# Patient Record
Sex: Female | Born: 1970 | Race: White | Hispanic: No | Marital: Married | Smoking: Never smoker
Health system: Southern US, Community
[De-identification: ages and names within clinical notes are randomized; demographics above are authoritative.]

## PROBLEM LIST (undated history)

## (undated) DIAGNOSIS — Z8742 Personal history of other diseases of the female genital tract: Secondary | ICD-10-CM

## (undated) DIAGNOSIS — I499 Cardiac arrhythmia, unspecified: Secondary | ICD-10-CM

## (undated) DIAGNOSIS — R102 Pelvic and perineal pain unspecified side: Secondary | ICD-10-CM

## (undated) DIAGNOSIS — M75100 Unspecified rotator cuff tear or rupture of unspecified shoulder, not specified as traumatic: Secondary | ICD-10-CM

## (undated) DIAGNOSIS — M25512 Pain in left shoulder: Secondary | ICD-10-CM

## (undated) HISTORY — DX: Unspecified rotator cuff tear or rupture of unspecified shoulder, not specified as traumatic: M75.100

## (undated) HISTORY — DX: Pain in left shoulder: M25.512

---

## 2002-09-26 ENCOUNTER — Encounter: Admission: RE | Admit: 2002-09-26 | Discharge: 2002-09-26 | Payer: Self-pay | Admitting: Obstetrics and Gynecology

## 2002-09-26 ENCOUNTER — Encounter: Payer: Self-pay | Admitting: Obstetrics and Gynecology

## 2003-10-31 ENCOUNTER — Other Ambulatory Visit: Admission: RE | Admit: 2003-10-31 | Discharge: 2003-10-31 | Payer: Self-pay | Admitting: Gynecology

## 2004-08-06 ENCOUNTER — Ambulatory Visit: Payer: Self-pay | Admitting: Internal Medicine

## 2004-09-09 ENCOUNTER — Other Ambulatory Visit: Admission: RE | Admit: 2004-09-09 | Discharge: 2004-09-09 | Payer: Self-pay | Admitting: Obstetrics and Gynecology

## 2004-10-02 ENCOUNTER — Ambulatory Visit (HOSPITAL_COMMUNITY): Admission: RE | Admit: 2004-10-02 | Discharge: 2004-10-02 | Payer: Self-pay | Admitting: Obstetrics and Gynecology

## 2004-10-02 HISTORY — PX: OTHER SURGICAL HISTORY: SHX169

## 2004-10-24 ENCOUNTER — Inpatient Hospital Stay (HOSPITAL_COMMUNITY): Admission: AD | Admit: 2004-10-24 | Discharge: 2004-10-24 | Payer: Self-pay | Admitting: Obstetrics and Gynecology

## 2005-02-05 ENCOUNTER — Encounter: Admission: RE | Admit: 2005-02-05 | Discharge: 2005-02-05 | Payer: Self-pay | Admitting: Obstetrics and Gynecology

## 2005-08-18 ENCOUNTER — Other Ambulatory Visit: Admission: RE | Admit: 2005-08-18 | Discharge: 2005-08-18 | Payer: Self-pay | Admitting: Obstetrics and Gynecology

## 2006-03-02 ENCOUNTER — Inpatient Hospital Stay (HOSPITAL_COMMUNITY): Admission: AD | Admit: 2006-03-02 | Discharge: 2006-03-06 | Payer: Self-pay | Admitting: Obstetrics and Gynecology

## 2009-12-17 ENCOUNTER — Telehealth: Payer: Self-pay | Admitting: Internal Medicine

## 2010-07-31 ENCOUNTER — Encounter
Admission: RE | Admit: 2010-07-31 | Discharge: 2010-07-31 | Payer: Self-pay | Source: Home / Self Care | Attending: Obstetrics and Gynecology | Admitting: Obstetrics and Gynecology

## 2010-08-12 NOTE — Progress Notes (Signed)
Summary: sinus inf x 5 - wants to reest & see you ASAP  Phone Note Call from Patient Call back at Home Phone [phone]   Caller: vm Summary of Call: Sinus infection 5th since before Christmas.  [facility] has been giving antibiotics.  Needs another answer.  Asks to see you ASAP to reest & fu on this problem.   Urgent Care tonight.  Tremendous head pressure & green mucus.  Also has 40 yr old whose ped [place] & would like to talk to you about taking her as patient.    Follow-up for Phone Call        would need records  and np visit again  .   to give opinion.   childfren can be put in wcc. Follow-up by: Madelin Headings MD,  December 19, 2009 10:46 AM  Additional Follow-up for Phone Call Additional follow up Details #1::        lmom also lmom 12-20-09 953am lmom 12-25-09 @ 914am lmom 01-01-2010 @935am   Additional Follow-up by: Heron Sabins,  December 19, 2009 11:29 AM    Additional Follow-up for Phone Call Additional follow up Details #2::    lmom 01-08-10 after several message left on vm pt did not return call Follow-up by: Heron Sabins,  January 08, 2010 9:40 AM  r

## 2010-11-28 NOTE — Op Note (Signed)
Ashlee Graham, Ashlee Graham                 ACCOUNT NO.:  [id number]   MEDICAL RECORD NO.:  [id number]          PATIENT TYPE:  INP   LOCATION:  9109                          FACILITY:  [facility]   PHYSICIAN:  [name], M.D.DATE OF BIRTH:  [birth year]   DATE OF PROCEDURE:  03/03/2006  DATE OF DISCHARGE:                                 OPERATIVE REPORT   PREOPERATIVE DIAGNOSES:  Intrauterine pregnancy at term, failure to progress  at 5 cm.   POSTOPERATIVE DIAGNOSES:  Intrauterine pregnancy at term, failure to  progress at 5 cm.   PROCEDURE:  Primary low transverse cesarean section.   SURGEON:  Dr. Vincente Poli.   ANESTHESIA:  Epidural.   SPECIMENS:  Female infant with Apgar's 8 at 1 minute and 9 at 5 minutes.   ESTIMATED BLOOD LOSS:  500 mL.   DESCRIPTION OF PROCEDURE:  The patient was taken to the operating room.  Her  epidural was dosed.  She was prepped and draped in the usual sterile  fashion.  A Foley catheter had been inserted while in labor and delivery.  A  low transverse incision was made and carried down to the fascia, the fascia  was __________ in the midline and extended laterally.  The rectus muscles  were separated in the midline and peritoneum was entered bluntly.  The  peritoneal incision was then stretched.  The bladder blade was inserted, the  lower uterine segment was identified and the bladder flap was created  sharply and then digitally.  The bladder blade was readjusted. A low  transverse incision was made in the uterus, uterus entered using a hemostat.  The baby was in cephalic presentation.  The baby was delivered with the  assistance of a vacuum extractor without difficulty. The baby was vigorous  on the abdomen and was a female infant, Apgar's 8 at 1 minute and 9 at 5  minutes. The cord was clamped and cut. The baby was handed to the waiting  neonatologist and subsequently taken to the newborn nursery. The placenta  was manually removed and noted to be normal and  intact with a three-vessel  cord.  The uterus was exteriorized and cleared of all clots and debris.  The  uterine incision was closed in layer using #0 chromic in continuous running  locked stitch.  Irrigation was performed after the uterus was returned to  the abdomen.  Hemostasis was excellent.  The peritoneum was reapproximated  using #0 Vicryl as well as the rectus muscles. The fascia was closed using  #0 Vicryl in continuous running stitch starting at each corner and meeting  in the midline. After irrigation of the subcutaneous layer, the skin was  closed with staples.  All sponge, lap and instrument counts were correct x2.  The patient went to the recovery room in stable condition      Michelle L. Vincente Poli, M.D.  Electronically Signed    MLG/MEDQ  D:  03/03/2006  T:  03/03/2006  Job:  [id number]

## 2010-11-28 NOTE — Op Note (Signed)
Ashlee Graham, Ashlee Graham                 ACCOUNT NO.:  [id number]   MEDICAL RECORD NO.:  [id number]          PATIENT TYPE:  AMB   LOCATION:  SDC                           [facility]ILITY:  [facility]   PHYSICIAN:  [name], M.D.DATE OF BIRTH:  03/06/1971   DATE OF PROCEDURE:  10/02/2004  DATE OF DISCHARGE:                                 OPERATIVE REPORT   PREOPERATIVE DIAGNOSIS:  Pelvic pain.   POSTOPERATIVE DIAGNOSES:  1.  Pelvic pain.  2.  Endometriosis involving the cul-de-sac and both ovaries.   PROCEDURE:  Laparoscopy, fulguration of endometriosis.   SURGEON:  Michelle L. Vincente Poli, M.D.   ANESTHESIA:  General.   SPECIMENS:  None.   ESTIMATED BLOOD LOSS:  Minimal.   COMPLICATIONS:  None.   PROCEDURE:  The patient was taken to the operating room.  She was then  intubated without difficulty.  She was prepped and draped in the usual  sterile fashion.  The patient had a uterine manipulator inserted and an in-  and-out catheter was used to empty the bladder of clear urine.  Attention  was turned to the abdomen, where a small infraumbilical incision was made  and the Veress needle was inserted one time without difficulty.  Pneumoperitoneum was then achieved.  The 11 mm trocar was then inserted.  The patient was then placed in Trendelenburg position.  The laparoscope was  introduced into the trocar sheath and the abdomen __________.  We then  placed a secondary 5 mm suprapubic port under direct visualization.  The  pelvis was carefully inspected.  The uterus was of normal size.  The  fallopian tubes appeared normal, and there was no endometriosis or  adhesions.  The fimbriated ends were visualized.  The bladder flap was  clear.  The cul-de-sac was fine except for three small powder burn areas on  the cul-de-sac on the patient's right side.  I used Kleppingers to fulgurate  those with good results.  There were no adhesions noted.  Both ovaries were  free without adhesions; however, there  were notably brownish spots on both  ovaries and one darker powder burn lesion on the left ovary.  All these  lesions were burned with good results.  The ovaries otherwise appeared  unremarkable.  At the end of the procedure, there was no other obvious  visible sign of endometriosis seen.  The pneumoperitoneum was released,  hemostasis was excellent.  The 5 mm trocar was removed under direct  visualization.  The 11 mm trocar was then removed.  We then closed the  incisions  with interrupteds using a 3-0 Vicryl suture.  Local was infiltrated.  All  instruments were removed from the vagina.  All sponge, lap and instrument  counts were correct x2.  The patient was extubated and went to the recovery  room in stable condition.      MLG/MEDQ  D:  10/02/2004  T:  10/02/2004  Job:  [id number]

## 2010-11-28 NOTE — Discharge Summary (Signed)
Ashlee Graham, Ashlee Graham                 ACCOUNT NO.:  [id number]   MEDICAL RECORD NO.:  [id number]          PATIENT TYPE:  INP   LOCATION:  9109                          [facility]ACILITY:  [facility]   PHYSICIAN:  Guy Sandifer. Henderson Cloud, M.D. DATE OF BIRTH:  12/18/1970   DATE OF ADMISSION:  03/02/2006  DATE OF DISCHARGE:  03/06/2006                                 DISCHARGE SUMMARY   ADMITTING DIAGNOSES:  1. Intrauterine pregnancy at 38-2/7th weeks estimated gestational age.  2. Spontaneous onset of labor.   DISCHARGE DIAGNOSES:  1. Status post low transverse cesarean section secondary to failure to      progress.  2. Viable female infant.   PROCEDURE:  Primary low transverse cesarean section.   REASON FOR ADMISSION:  Please see written H&P.   HOSPITAL COURSE:  The patient is 40 year old primigravida that was admitted  to [facility] at 38-2/7th weeks in early labor.  Pregnancy  had been otherwise uncomplicated.  On admission, vital signs stable.  Fetal  heart tones were reactive.  Contractions were noted to be somewhat  irregular.  Cervix was now dilated to 5 cm, 80% effaced, vertex at a -2  station.  Artificial rupture of membranes was performed which revealed clear  fluid.  Epidural was administered for the patient's comfort.  After several  hours in good labor pattern all day, the patient was noted to be unchanged  with cervix remaining 5 cm vertex at a 0 station.  There was rim of scar  tissue that was felt all the way around the cervix and was thought to be  related to previous cryotherapy.  Decision was made to proceed with a  primary low transverse cesarean section for failure to progress.  The  patient was then taken to the operating room where epidural was dosed to an  adequate surgical level.  A low transverse incision was made with delivery  of a viable female infant weighing 7 pounds 6 ounces with Apgar's of 8 at 1  minute and 9 at 5 minutes.  The patient tolerated the  procedure well and  taken to the recovery room in stable condition.   On postoperative day #2, the patient was without complaint.  Vital signs  were stable.  Fundus was firm and nontender.  Abdominal dressing was noted  to be clean, dry and intact.   On postoperative day #2, the patient did complain of some soreness and  drainage from the incisional site.  Vital signs were stable.  She was  afebrile.  Abdomen soft with good return of bowel function.  Fundus firm and  nontender.  Incision was clean, dry and intact and there was a small amount  of ecchymosis that was noted superior to the incisional site.  Small edema  was also noted on the mons veneris.  No active drainage was noted.  Laboratory findings revealed hemoglobin of 9.1, platelet count 143,000, WBC  count of 13.0.   On postoperative day #3, the patient denied headache, blurred vision or  epigastric pain.  Vital signs were stable.  Blood pressure was 111-133/91-  93.  Deep tendon reflexes were 2 to 3+ without clonus.  Small pedal edema  was also noted.  Abdomen was soft.  Fundus firm and nontender.  Incision  continued with ecchymosis superior and inferior to the incisional site with  extension into the labia.  Discharge instructions were reviewed.  PIH labs  were drawn which revealed value of AST of 46 and ALT of 31.  Decision was  made to observe the patient overnight.   On the following morning, the patient complained of a headache, right  occipital without visual changes or epigastric discomfort.  Vital signs  remained stable with blood pressure 115-136/69-89.  Abdomen was soft.  Incision clean, dry and intact.  Deep tendon reflexes 2+ without clonus.  Laboratory findings revealed platelets up to 248, SGOT 52 and SGPT 44,  creatinine was 0.9.  Repeat PIH labs were ordered for the afternoon.  Later  that afternoon, vital signs remained stable.  Laboratory findings of  hemoglobin stable at 10.3, platelet count 231,000, WBC  count 9.0, AST was  down to 47, ALT 39, uric acid was 4.2.  Discharge instructions was reviewed  and the patient was later discharged home.   CONDITION ON DISCHARGE:  Stable.   DIET:  Regular as tolerated.   ACTIVITY:  No heavy lifting, no driving x2 weeks, no vaginal entry.   FOLLOW UP:  The patient is to follow up in the office in 2 days for a blood  pressure check.  She is to call for headache unrelieved by Tylenol,  epigastric pain or blurred vision.  The patient was also encouraged to call  for temperature greater than 100.5, redness or drainage from the incisional  site, heavy vaginal bleeding or development of nausea and vomiting.   DISCHARGE MEDICATIONS:  1. Percocet 5/325, #30, one p.o. q.4-6h. p.r.n.  2. Motrin 600 mg q.6h.  3. Prenatal vitamins one p.o. daily.      Julio Sicks, N.P.      Guy Sandifer. Henderson Cloud, M.D.  Electronically Signed    CC/MEDQ  D:  03/30/2006  T:  03/31/2006  Job:  [id number]

## 2012-03-30 ENCOUNTER — Ambulatory Visit (INDEPENDENT_AMBULATORY_CARE_PROVIDER_SITE_OTHER): Payer: BC Managed Care – PPO | Admitting: Sports Medicine

## 2012-03-30 VITALS — BP 118/70 | Ht 68.0 in | Wt 140.0 lb

## 2012-03-30 DIAGNOSIS — M25512 Pain in left shoulder: Secondary | ICD-10-CM | POA: Insufficient documentation

## 2012-03-30 DIAGNOSIS — M25519 Pain in unspecified shoulder: Secondary | ICD-10-CM

## 2012-03-30 DIAGNOSIS — M719 Bursopathy, unspecified: Secondary | ICD-10-CM

## 2012-03-30 DIAGNOSIS — M75100 Unspecified rotator cuff tear or rupture of unspecified shoulder, not specified as traumatic: Secondary | ICD-10-CM | POA: Insufficient documentation

## 2012-03-30 HISTORY — DX: Unspecified rotator cuff tear or rupture of unspecified shoulder, not specified as traumatic: M75.100

## 2012-03-30 HISTORY — DX: Pain in left shoulder: M25.512

## 2012-03-30 MED ORDER — NITROGLYCERIN 0.2 MG/HR TD PT24: MEDICATED_PATCH | TRANSDERMAL | Status: DC

## 2012-03-30 NOTE — Assessment & Plan Note (Signed)
We reviewed a series of home exercises to include both rotator cuff strengthening and scapular stabilization. She will also implement a feeding gentle stretches to improve her elevation and external rotation.  Begin a nitroglycerin protocol. Plan on returning in 6 weeks for me to reevaluate her examination but we probably do not need to repeat the ultrasound unless symptoms worsen.

## 2012-03-30 NOTE — Assessment & Plan Note (Signed)
She can continue to use ibuprofen as needed but hopefully after starting nitroglycerin will be able to use less of the pain medication

## 2012-03-30 NOTE — Progress Notes (Signed)
  Subjective:    Patient ID: Ashlee Graham, female    DOB: Jul 31, 1970, 41 y.o.   MRN: [id number]  HPI 1. Shoulder pain:  Here with complaint of L shoulder pain that started in February.  She states she was going to boot camp classes and shoulder pain started after a day of doing strenuous shoulder workouts.  Initially hurt with most movements and pushing herself up from a seated position.  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  She has tried rehab exercises, ultrasound and scapular stabilization and has had a significant improvement in pain. She denies swelling, numbness, or tingling down the arm.    No nighttime pain.  For the first few weeks after her injury she was very weak and had to use a sling.  Now the strength while improved is still very weak with overhead activities   Review of Systems Per HPI    Objective:   Physical Exam No acute distress Good posture No limitation of neck motion or pain  Left Shoulder: Inspection reveals mild infraspinatus atrophy but no other atrophied or asymmetry. Palpation with mild tenderness over posterior aspect of shoulder.  No tenderness over AC joint or bicipital groove. ROM is full in all planes except overhead movement Rotator cuff s Positive signs of impingement with Neer and Hawkin's tests, empty can. Speeds and Yergason's tests normal. No labral pathology noted with negative Obrien's, negative clunk and good stability. Normal scapular function observed. No painful arc and no drop arm sign. No apprehension sign  Weakness on impingement seems primarily secondary to pain  MSK ultrasound Infraspinatus tendon is mildly atrophied at its insertion and also shows hyperechoic change consistent with some fibrosis Supraspinatous tendon shows one very small area at the insertion in which there is a 0.2 cm gap Doppler flow is normal Subscapularis and teres minor tendons are normal. Bursa is  normal. Bicipital tendon is normal        Assessment & Plan:

## 2014-04-16 ENCOUNTER — Other Ambulatory Visit (HOSPITAL_COMMUNITY): Payer: Self-pay | Admitting: Obstetrics and Gynecology

## 2014-04-16 DIAGNOSIS — R109 Unspecified abdominal pain: Secondary | ICD-10-CM

## 2014-04-16 DIAGNOSIS — R509 Fever, unspecified: Secondary | ICD-10-CM

## 2014-04-18 ENCOUNTER — Ambulatory Visit (HOSPITAL_COMMUNITY)
Admission: RE | Admit: 2014-04-18 | Discharge: 2014-04-18 | Disposition: A | Discharge status: Home / Self Care | Payer: No Typology Code available for payment source | Source: Ambulatory Visit | Attending: Obstetrics and Gynecology | Admitting: Obstetrics and Gynecology

## 2014-04-18 DIAGNOSIS — R509 Fever, unspecified: Secondary | ICD-10-CM | POA: Diagnosis not present

## 2014-04-18 DIAGNOSIS — R109 Unspecified abdominal pain: Secondary | ICD-10-CM

## 2014-04-20 ENCOUNTER — Ambulatory Visit (HOSPITAL_COMMUNITY): Payer: No Typology Code available for payment source

## 2014-11-12 ENCOUNTER — Other Ambulatory Visit: Payer: Self-pay | Admitting: Obstetrics and Gynecology

## 2014-11-12 ENCOUNTER — Other Ambulatory Visit: Payer: Self-pay

## 2014-11-12 DIAGNOSIS — N63 Unspecified lump in unspecified breast: Secondary | ICD-10-CM

## 2014-11-15 ENCOUNTER — Ambulatory Visit
Admission: RE | Admit: 2014-11-15 | Discharge: 2014-11-15 | Disposition: A | Discharge status: Home / Self Care | Payer: No Typology Code available for payment source | Source: Ambulatory Visit | Attending: Obstetrics and Gynecology | Admitting: Obstetrics and Gynecology

## 2014-11-15 DIAGNOSIS — N63 Unspecified lump in unspecified breast: Secondary | ICD-10-CM

## 2015-08-16 NOTE — H&P (Signed)
  45 year old G 1 P 1 with pelvic pain. She has a history of endometriosis.   Pain has been "gripping" the week before her period and during her period.  She is reporting pain and cramping.  She thought it was her back and has actually gone and had back injections and all of that has not helped her.    Past Medical history: Endometriosis  Past Surgical History: 2 laparoscopies for endometriosis C Section  Meds: None  Allergic to Pennicillin   Social history: Denies alcohol or tobacco  Family history: None  Afebrile Vital signs stable General alert and oriented Lung CTAB Car RRR Abdomen is soft and non tender Cervix no lesions.  Uterus is normal size. Adnexa has significant tenderness in the right levator Left adnexae non tender  IMPRESSION: Chronic pelvic pain History of endometriosis  PLAN: Laparoscopy with Hassan trocar Possible fulguration of endometriosis Risks reviewed with patient

## 2015-08-23 ENCOUNTER — Encounter (HOSPITAL_BASED_OUTPATIENT_CLINIC_OR_DEPARTMENT_OTHER): Payer: Self-pay | Admitting: *Deleted

## 2015-08-23 NOTE — Progress Notes (Signed)
NPO AFTER MN.  ARRIVE AT 0600.  NEEDS HG AND URINE PREG.  

## 2015-08-28 NOTE — Anesthesia Preprocedure Evaluation (Addendum)
Anesthesia Evaluation  Patient identified by MRN, date of birth, ID band Patient awake    Reviewed: Allergy & Precautions, NPO status , Patient's Chart, lab work & pertinent test results  Airway Mallampati: I  TM Distance: >3 FB Neck ROM: Full    Dental  (+) Teeth Intact, Dental Advisory Given   Pulmonary neg pulmonary ROS,    Pulmonary exam normal breath sounds clear to auscultation       Cardiovascular negative cardio ROS Normal cardiovascular exam Rhythm:Regular Rate:Normal     Neuro/Psych negative neurological ROS  negative psych ROS   GI/Hepatic negative GI ROS, Neg liver ROS,   Endo/Other  negative endocrine ROS  Renal/GU negative Renal ROS     Musculoskeletal negative musculoskeletal ROS (+)   Abdominal   Peds  Hematology negative hematology ROS (+)   Anesthesia Other Findings Day of surgery medications reviewed with the patient.  Reproductive/Obstetrics History of endometriosis                            Anesthesia Physical Anesthesia Plan  ASA: II  Anesthesia Plan: General   Post-op Pain Management:    Induction: Intravenous  Airway Management Planned: Oral ETT  Additional Equipment:   Intra-op Plan:   Post-operative Plan: Extubation in OR  Informed Consent: I have reviewed the patients History and Physical, chart, labs and discussed the procedure including the risks, benefits and alternatives for the proposed anesthesia with the patient or authorized representative who has indicated his/her understanding and acceptance.   Dental advisory given  Plan Discussed with: CRNA  Anesthesia Plan Comments: (Risks/benefits of general anesthesia discussed with patient including risk of damage to teeth, lips, gum, and tongue, nausea/vomiting, allergic reactions to medications, and the possibility of heart attack, stroke and death.  All patient questions answered.  Patient  wishes to proceed.)        Anesthesia Quick Evaluation

## 2015-08-29 ENCOUNTER — Encounter (HOSPITAL_BASED_OUTPATIENT_CLINIC_OR_DEPARTMENT_OTHER)
Admission: RE | Disposition: A | Discharge status: Home / Self Care | Payer: Self-pay | Source: Ambulatory Visit | Attending: Obstetrics and Gynecology

## 2015-08-29 ENCOUNTER — Encounter (HOSPITAL_BASED_OUTPATIENT_CLINIC_OR_DEPARTMENT_OTHER): Payer: Self-pay | Admitting: *Deleted

## 2015-08-29 ENCOUNTER — Ambulatory Visit (HOSPITAL_BASED_OUTPATIENT_CLINIC_OR_DEPARTMENT_OTHER): Payer: No Typology Code available for payment source | Admitting: Anesthesiology

## 2015-08-29 ENCOUNTER — Ambulatory Visit (HOSPITAL_BASED_OUTPATIENT_CLINIC_OR_DEPARTMENT_OTHER)
Admission: RE | Admit: 2015-08-29 | Discharge: 2015-08-29 | Disposition: A | Discharge status: Home / Self Care | Payer: No Typology Code available for payment source | Source: Ambulatory Visit | Attending: Obstetrics and Gynecology | Admitting: Obstetrics and Gynecology

## 2015-08-29 DIAGNOSIS — R102 Pelvic and perineal pain: Secondary | ICD-10-CM | POA: Diagnosis present

## 2015-08-29 DIAGNOSIS — N83202 Unspecified ovarian cyst, left side: Secondary | ICD-10-CM | POA: Insufficient documentation

## 2015-08-29 HISTORY — PX: LAPAROSCOPY: SHX197

## 2015-08-29 HISTORY — DX: Personal history of other diseases of the female genital tract: Z87.42

## 2015-08-29 HISTORY — DX: Pelvic and perineal pain: R10.2

## 2015-08-29 HISTORY — DX: Pelvic and perineal pain unspecified side: R10.20

## 2015-08-29 LAB — POCT HEMOGLOBIN-HEMACUE: Hemoglobin: 14.2 g/dL (ref 12.0–15.0)

## 2015-08-29 LAB — POCT PREGNANCY, URINE: PREG TEST UR: NEGATIVE

## 2015-08-29 SURGERY — LAPAROSCOPY, DIAGNOSTIC
Anesthesia: General | Site: Abdomen

## 2015-08-29 MED ORDER — ARTIFICIAL TEARS OP OINT
TOPICAL_OINTMENT | OPHTHALMIC | Status: AC
  Filled 2015-08-29: qty 3.5

## 2015-08-29 MED ORDER — KETOROLAC TROMETHAMINE 30 MG/ML IJ SOLN
INTRAMUSCULAR | Status: DC | PRN
  Administered 2015-08-29: 30 mg via INTRAVENOUS

## 2015-08-29 MED ORDER — NEOSTIGMINE METHYLSULFATE 10 MG/10ML IV SOLN
INTRAVENOUS | Status: AC
  Filled 2015-08-29: qty 1

## 2015-08-29 MED ORDER — PROMETHAZINE HCL 25 MG/ML IJ SOLN
6.2500 mg | INTRAMUSCULAR | Status: DC | PRN
  Filled 2015-08-29: qty 1

## 2015-08-29 MED ORDER — GENTAMICIN SULFATE 40 MG/ML IJ SOLN
INTRAVENOUS | Status: AC
  Administered 2015-08-29: 100 mL via INTRAVENOUS
  Filled 2015-08-29 (×2): qty 8.25

## 2015-08-29 MED ORDER — ROCURONIUM BROMIDE 100 MG/10ML IV SOLN
INTRAVENOUS | Status: AC
  Filled 2015-08-29: qty 1

## 2015-08-29 MED ORDER — OXYCODONE-ACETAMINOPHEN 10-325 MG PO TABS: 1.0000 | ORAL_TABLET | ORAL | Status: DC | PRN

## 2015-08-29 MED ORDER — KETOROLAC TROMETHAMINE 30 MG/ML IJ SOLN
INTRAMUSCULAR | Status: AC
  Filled 2015-08-29: qty 1

## 2015-08-29 MED ORDER — NEOSTIGMINE METHYLSULFATE 10 MG/10ML IV SOLN
INTRAVENOUS | Status: DC | PRN
  Administered 2015-08-29: 4 mg via INTRAVENOUS

## 2015-08-29 MED ORDER — FENTANYL CITRATE (PF) 100 MCG/2ML IJ SOLN
INTRAMUSCULAR | Status: DC | PRN
  Administered 2015-08-29 (×3): 50 ug via INTRAVENOUS

## 2015-08-29 MED ORDER — FENTANYL CITRATE (PF) 100 MCG/2ML IJ SOLN
25.0000 ug | INTRAMUSCULAR | Status: DC | PRN
  Filled 2015-08-29: qty 1

## 2015-08-29 MED ORDER — MIDAZOLAM HCL 2 MG/2ML IJ SOLN
INTRAMUSCULAR | Status: AC
  Filled 2015-08-29: qty 2

## 2015-08-29 MED ORDER — MIDAZOLAM HCL 5 MG/5ML IJ SOLN
INTRAMUSCULAR | Status: DC | PRN
  Administered 2015-08-29: 2 mg via INTRAVENOUS

## 2015-08-29 MED ORDER — LACTATED RINGERS IV SOLN
INTRAVENOUS | Status: DC
  Filled 2015-08-29: qty 1000

## 2015-08-29 MED ORDER — LIDOCAINE HCL (CARDIAC) 20 MG/ML IV SOLN
INTRAVENOUS | Status: AC
  Filled 2015-08-29: qty 5

## 2015-08-29 MED ORDER — FENTANYL CITRATE (PF) 100 MCG/2ML IJ SOLN
INTRAMUSCULAR | Status: AC
  Filled 2015-08-29: qty 2

## 2015-08-29 MED ORDER — GLYCOPYRROLATE 0.2 MG/ML IJ SOLN
INTRAMUSCULAR | Status: AC
  Filled 2015-08-29: qty 3

## 2015-08-29 MED ORDER — LIDOCAINE HCL 4 % EX SOLN
CUTANEOUS | Status: DC | PRN
  Administered 2015-08-29: 2 mL via TOPICAL

## 2015-08-29 MED ORDER — PROPOFOL 10 MG/ML IV BOLUS
INTRAVENOUS | Status: DC | PRN
  Administered 2015-08-29: 200 mg via INTRAVENOUS

## 2015-08-29 MED ORDER — DEXAMETHASONE SODIUM PHOSPHATE 4 MG/ML IJ SOLN
INTRAMUSCULAR | Status: DC | PRN
  Administered 2015-08-29: 10 mg via INTRAVENOUS

## 2015-08-29 MED ORDER — SUCCINYLCHOLINE CHLORIDE 20 MG/ML IJ SOLN
INTRAMUSCULAR | Status: DC | PRN
  Administered 2015-08-29: 100 mg via INTRAVENOUS

## 2015-08-29 MED ORDER — LACTATED RINGERS IV SOLN
INTRAVENOUS | Status: DC
  Administered 2015-08-29 (×2): via INTRAVENOUS
  Filled 2015-08-29: qty 1000

## 2015-08-29 MED ORDER — LIDOCAINE HCL (CARDIAC) 20 MG/ML IV SOLN
INTRAVENOUS | Status: DC | PRN
  Administered 2015-08-29: 60 mg via INTRAVENOUS

## 2015-08-29 MED ORDER — ONDANSETRON HCL 4 MG/2ML IJ SOLN
INTRAMUSCULAR | Status: AC
  Filled 2015-08-29: qty 2

## 2015-08-29 MED ORDER — BUPIVACAINE-EPINEPHRINE (PF) 0.25% -1:200000 IJ SOLN
INTRAMUSCULAR | Status: AC
  Filled 2015-08-29: qty 30

## 2015-08-29 MED ORDER — ONDANSETRON HCL 4 MG/2ML IJ SOLN
INTRAMUSCULAR | Status: DC | PRN
  Administered 2015-08-29: 4 mg via INTRAVENOUS

## 2015-08-29 MED ORDER — ROCURONIUM BROMIDE 100 MG/10ML IV SOLN
INTRAVENOUS | Status: DC | PRN
  Administered 2015-08-29: 20 mg via INTRAVENOUS

## 2015-08-29 MED ORDER — BUPIVACAINE HCL (PF) 0.25 % IJ SOLN
INTRAMUSCULAR | Status: AC
  Filled 2015-08-29: qty 30

## 2015-08-29 MED ORDER — DEXAMETHASONE SODIUM PHOSPHATE 10 MG/ML IJ SOLN
INTRAMUSCULAR | Status: AC
  Filled 2015-08-29: qty 1

## 2015-08-29 MED ORDER — PROPOFOL 10 MG/ML IV BOLUS
INTRAVENOUS | Status: AC
  Filled 2015-08-29: qty 40

## 2015-08-29 MED ORDER — GLYCOPYRROLATE 0.2 MG/ML IJ SOLN
INTRAMUSCULAR | Status: DC | PRN
  Administered 2015-08-29: 0.6 mg via INTRAVENOUS

## 2015-08-29 SURGICAL SUPPLY — 47 items
APPLICATOR COTTON TIP 6IN STRL (MISCELLANEOUS) ×3 IMPLANT
BAG SPEC RTRVL LRG 6X4 10 (ENDOMECHANICALS)
BALLN HASSAN TROCAR (MISCELLANEOUS) ×3 IMPLANT
BANDAGE ADHESIVE 1X3 (GAUZE/BANDAGES/DRESSINGS) ×6 IMPLANT
BLADE SURG 11 STRL SS (BLADE) ×3 IMPLANT
CANISTER SUCTION 2500CC (MISCELLANEOUS) IMPLANT
CLOSURE WOUND 1/4X4 (GAUZE/BANDAGES/DRESSINGS)
DRAPE UNDERBUTTOCKS STRL (DRAPE) ×3 IMPLANT
DRSG TELFA 3X8 NADH (GAUZE/BANDAGES/DRESSINGS) IMPLANT
ELECT REM PT RETURN 9FT ADLT (ELECTROSURGICAL) ×3
ELECTRODE REM PT RTRN 9FT ADLT (ELECTROSURGICAL) ×1 IMPLANT
GLOVE BIO SURGEON STRL SZ 6.5 (GLOVE) ×4 IMPLANT
GLOVE BIO SURGEONS STRL SZ 6.5 (GLOVE) ×2
GOWN STRL REUS W/ TWL LRG LVL3 (GOWN DISPOSABLE) ×2 IMPLANT
GOWN STRL REUS W/TWL LRG LVL3 (GOWN DISPOSABLE) ×6
KIT ROOM TURNOVER WOR (KITS) ×3 IMPLANT
LIQUID BAND (GAUZE/BANDAGES/DRESSINGS) ×2 IMPLANT
MANIFOLD NEPTUNE II (INSTRUMENTS) IMPLANT
NDL HYPO 25X1 1.5 SAFETY (NEEDLE) IMPLANT
NDL INSUFFLATION 14GA 120MM (NEEDLE) ×1 IMPLANT
NDL INSUFFLATION 14GA 150MM (NEEDLE) IMPLANT
NEEDLE HYPO 25X1 1.5 SAFETY (NEEDLE) IMPLANT
NEEDLE INSUFFLATION 14GA 120MM (NEEDLE) ×3 IMPLANT
NEEDLE INSUFFLATION 14GA 150MM (NEEDLE) IMPLANT
NS IRRIG 500ML POUR BTL (IV SOLUTION) ×1 IMPLANT
PACK BASIN DAY SURGERY FS (CUSTOM PROCEDURE TRAY) ×3 IMPLANT
PACK LAPAROSCOPY II (CUSTOM PROCEDURE TRAY) ×3 IMPLANT
PAD OB MATERNITY 4.3X12.25 (PERSONAL CARE ITEMS) ×3 IMPLANT
PAD PREP 24X48 CUFFED NSTRL (MISCELLANEOUS) ×3 IMPLANT
PADDING ION DISPOSABLE (MISCELLANEOUS) ×3 IMPLANT
POUCH SPECIMEN RETRIEVAL 10MM (ENDOMECHANICALS) IMPLANT
SCISSORS LAP 5X35 DISP (ENDOMECHANICALS) IMPLANT
SOLUTION ANTI FOG 6CC (MISCELLANEOUS) ×3 IMPLANT
STRIP CLOSURE SKIN 1/4X4 (GAUZE/BANDAGES/DRESSINGS) IMPLANT
SUT VIC AB 3-0 PS2 18 (SUTURE) ×3
SUT VIC AB 3-0 PS2 18XBRD (SUTURE) ×1 IMPLANT
SUT VICRYL 0 UR6 27IN ABS (SUTURE) ×3 IMPLANT
SYR CONTROL 10ML LL (SYRINGE) IMPLANT
SYRINGE 10CC LL (SYRINGE) ×9 IMPLANT
TOWEL OR 17X24 6PK STRL BLUE (TOWEL DISPOSABLE) ×6 IMPLANT
TRAY DSU PREP LF (CUSTOM PROCEDURE TRAY) ×3 IMPLANT
TRAY FOLEY CATH 16FR SILVER (SET/KITS/TRAYS/PACK) ×2 IMPLANT
TROCAR OPTI TIP 5M 100M (ENDOMECHANICALS) ×3 IMPLANT
TROCAR XCEL DIL TIP R 11M (ENDOMECHANICALS) ×3 IMPLANT
TUBE CONNECTING 12'X1/4 (SUCTIONS)
TUBE CONNECTING 12X1/4 (SUCTIONS) IMPLANT
TUBING INSUFFLATION 10FT LAP (TUBING) ×1 IMPLANT

## 2015-08-29 NOTE — Transfer of Care (Addendum)
Last Vitals:  Filed Vitals:   08/29/15 0616  BP: 111/71  Pulse: 59  Temp: 36.9 C  Resp: 16   Immediate Anesthesia Transfer of Care Note  Patient: Ashlee Graham  Procedure(s) Performed: Procedure(s) (LRB): LAPAROSCOPY DIAGNOSTIC , drainage of left ovarian cyst (N/A)  Patient Location: PACU  Anesthesia Type: General  Level of Consciousness: awake, alert  and oriented  Airway & Oxygen Therapy: Patient Spontanous Breathing and Patient connected to nasal cannula oxygen  Post-op Assessment: Report given to PACU RN and Post -op Vital signs reviewed and stable  Post vital signs: Reviewed and stable  Complications: No apparent anesthesia complications

## 2015-08-29 NOTE — Anesthesia Postprocedure Evaluation (Signed)
Anesthesia Post Note  Patient: Ashlee Graham  Procedure(s) Performed: Procedure(s) (LRB): LAPAROSCOPY DIAGNOSTIC , drainage of left ovarian cyst (N/A)  Patient location during evaluation: PACU Anesthesia Type: General Level of consciousness: awake and alert Pain management: pain level controlled Vital Signs Assessment: post-procedure vital signs reviewed and stable Respiratory status: spontaneous breathing, nonlabored ventilation, respiratory function stable and patient connected to nasal cannula oxygen Cardiovascular status: blood pressure returned to baseline and stable Postop Assessment: no signs of nausea or vomiting Anesthetic complications: no    Last Vitals:  Filed Vitals:   08/29/15 0915 08/29/15 1025  BP: 117/79 122/77  Pulse: 59 48  Temp:  36.5 C  Resp: 16 14    Last Pain:  Filed Vitals:   08/29/15 1030  PainSc: 1                  Cecile Hearing

## 2015-08-29 NOTE — Discharge Instructions (Signed)

## 2015-08-29 NOTE — Anesthesia Procedure Notes (Signed)
Procedure Name: Intubation Date/Time: 08/29/2015 7:35 AM Performed by: Mechele Claude Pre-anesthesia Checklist: Patient identified, Emergency Drugs available, Suction available and Patient being monitored Patient Re-evaluated:Patient Re-evaluated prior to inductionOxygen Delivery Method: Circle System Utilized Preoxygenation: Pre-oxygenation with 100% oxygen Intubation Type: IV induction Ventilation: Mask ventilation without difficulty Laryngoscope Size: Mac and 3 Grade View: Grade I Tube type: Oral Number of attempts: 1 Airway Equipment and Method: Stylet and LTA kit utilized Placement Confirmation: ETT inserted through vocal cords under direct vision,  positive ETCO2 and breath sounds checked- equal and bilateral Secured at: 21 cm Tube secured with: Tape Dental Injury: Teeth and Oropharynx as per pre-operative assessment

## 2015-08-29 NOTE — Brief Op Note (Signed)
08/29/2015  8:19 AM  PATIENT:  Ashlee Graham  45 y.o. female  PRE-OPERATIVE DIAGNOSIS:  Pelvic pain History of endometriosis  POST-OPERATIVE DIAGNOSIS:   Same  PROCEDURE:  Procedure(s): LAPAROSCOPY DIAGNOSTIC , drainage of left ovarian cyst (N/A)  SURGEON:  Surgeon(s) and Role:    * Marcelle Overlie, MD - Primary  PHYSICIAN ASSISTANT:   ASSISTANTS: none   ANESTHESIA:   general  EBL:  Total I/O In: 1000 [I.V.:1000] Out: -   BLOOD ADMINISTERED:none  DRAINS: none   LOCAL MEDICATIONS USED:  NONE  SPECIMEN:  No Specimen  DISPOSITION OF SPECIMEN:  N/A  COUNTS:  YES  TOURNIQUET:  * No tourniquets in log *  DICTATION: .Other Dictation: Dictation Number [id number]  PLAN OF CARE: Discharge to home after PACU  PATIENT DISPOSITION:  PACU - hemodynamically stable.   Delay start of Pharmacological VTE agent (>24hrs) due to surgical blood loss or risk of bleeding: not applicable

## 2015-08-29 NOTE — Progress Notes (Signed)
H and P on the chart No changes Will proceed with Diagnostic Laparoscopy Consent signed 

## 2015-08-30 ENCOUNTER — Encounter (HOSPITAL_BASED_OUTPATIENT_CLINIC_OR_DEPARTMENT_OTHER): Payer: Self-pay | Admitting: Obstetrics and Gynecology

## 2015-08-30 NOTE — Op Note (Signed)
NAMESMERA, GUYETTE NO.:  [id number]  MEDICAL RECORD NO.:  [id number]  LOCATION:                                 FACILITY:  PHYSICIAN:  Jahshua Bonito L. Theseus Birnie, M.D.DATE OF BIRTH:  10-18-1970  DATE OF PROCEDURE:  08/29/2015 DATE OF DISCHARGE:                              OPERATIVE REPORT   PREOPERATIVE DIAGNOSES: 1. Pelvic pain. 2. History of endometriosis.  POSTOPERATIVE DIAGNOSES: 1. Pelvic pain. 2. History of endometriosis.  PROCEDURES:  Diagnostic laparoscopy and drainage of left ovarian cyst.  SURGEON:  Jazyah Butsch L. Vincente Poli, M.D.  ANESTHESIA:  General.  COMPLICATIONS:  None.  DRAINS:  None.  ESTIMATED BLOOD LOSS:  Minimal.  PROCEDURE:  The patient was taken to the operating room.  She was intubated.  She was prepped and draped.  A uterine manipulator was inserted and a Foley catheter was inserted and draining clear urine. Attention was turned to the abdomen where a small incision was made, and we directly entered the fascia using Mayo scissors.  We then entered the peritoneum with Mayo scissors.  I then swept my finger around the incision, and noted that there were no immediate adhesions inside, and placed my angled stitches on either side.  I then inserted the Hasson trocar and insufflated the abdominal cavity.  The laparoscope scope was inserted, and I did notice that there was a small adhesion of the omentum just to the lower left of my incision.  This is not in the area where the patient's pain is.  We then inserted a secondary trocar under direct visualization using a 5 mm trocar.  I then inspected the pelvis; noted the uterus to be normal, and the right tube and right ovary were normal.  The left ovary had a small hemorrhagic cyst, which I had drained with the electrocautery.  There was no area of endometriosis or no adhesions noted.  I then inspected the right upper quadrant.  No adhesions were noted.  No endometriotic implants  were noted.  The gallbladder appeared to be normal.  I did ask Dr. Maisie Fus of General Surgery to come in and inspect the gallbladder, and she felt it was normal in appearance.  The pneumoperitoneum was released.  The trocars were removed.  The angled stitches were tied down.  These incisions were closed with Dermabond and suture.  Hemostasis was noted.  All instruments were removed from the vagina.  The Foley was removed.  The patient was extubated and went to recovery room in stable condition.     Nikeia Henkes L. Vincente Poli, M.D.     Florestine Avers  D:  08/29/2015  T:  08/29/2015  Job:  [id number]

## 2015-12-04 ENCOUNTER — Other Ambulatory Visit: Payer: Self-pay | Admitting: Neurological Surgery

## 2015-12-10 ENCOUNTER — Other Ambulatory Visit (HOSPITAL_COMMUNITY): Payer: Self-pay | Admitting: Neurological Surgery

## 2015-12-10 DIAGNOSIS — M4317 Spondylolisthesis, lumbosacral region: Secondary | ICD-10-CM

## 2015-12-17 ENCOUNTER — Ambulatory Visit (HOSPITAL_COMMUNITY): Payer: No Typology Code available for payment source

## 2015-12-19 ENCOUNTER — Ambulatory Visit (HOSPITAL_COMMUNITY)
Admission: RE | Admit: 2015-12-19 | Discharge: 2015-12-19 | Disposition: A | Discharge status: Home / Self Care | Payer: No Typology Code available for payment source | Source: Ambulatory Visit | Attending: Neurological Surgery | Admitting: Neurological Surgery

## 2015-12-19 ENCOUNTER — Encounter (HOSPITAL_COMMUNITY): Payer: Self-pay

## 2015-12-19 DIAGNOSIS — M4316 Spondylolisthesis, lumbar region: Secondary | ICD-10-CM | POA: Diagnosis not present

## 2015-12-19 DIAGNOSIS — M4806 Spinal stenosis, lumbar region: Secondary | ICD-10-CM | POA: Insufficient documentation

## 2015-12-19 DIAGNOSIS — M4807 Spinal stenosis, lumbosacral region: Secondary | ICD-10-CM | POA: Diagnosis not present

## 2015-12-19 DIAGNOSIS — M4317 Spondylolisthesis, lumbosacral region: Secondary | ICD-10-CM | POA: Insufficient documentation

## 2016-08-05 ENCOUNTER — Other Ambulatory Visit: Payer: Self-pay | Admitting: Obstetrics and Gynecology

## 2016-08-05 DIAGNOSIS — R928 Other abnormal and inconclusive findings on diagnostic imaging of breast: Secondary | ICD-10-CM

## 2016-08-07 ENCOUNTER — Ambulatory Visit
Admission: RE | Admit: 2016-08-07 | Discharge: 2016-08-07 | Disposition: A | Discharge status: Home / Self Care | Payer: No Typology Code available for payment source | Source: Ambulatory Visit | Attending: Obstetrics and Gynecology | Admitting: Obstetrics and Gynecology

## 2016-08-07 DIAGNOSIS — R928 Other abnormal and inconclusive findings on diagnostic imaging of breast: Secondary | ICD-10-CM

## 2017-04-12 NOTE — Progress Notes (Signed)
CARDIOLOGY OFFICE NOTE  Date:  04/13/2017    Ashlee Graham Date of Birth: 1970-09-29 Medical Record #[id number]  PCP:  Ashlee Overlie, MD  Cardiologist:  Ashlee Graham (NEW/DOD)   Chief Complaint  Patient presents with  . Chest Pain    New patient visit - seen for Dr. Mayford Knife (NEW)    History of Present Illness: Ashlee Graham is a 46 y.o. female who presents today for a new patient visit. Seen for Dr. Mayford Knife (DOD).   She has no known CAD.   Referred here by her GYN - Dr. Vincente Graham - has +FH for early CAD.   Comes in today. Here alone.  Here for +FH - her father died in his early 62's with MI. This just happened back in March. Multiple other family members on her dad's side have had early CAD/MI. Mother is alive - has had breast cancer. 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 She has had some twinges of chest pain across her chest that are fleeting. She has had some back pain intermittently. She tries to exercise but one day got lightheaded when she had not eaten or drank enough fluids. She notes lots  She has multiple somatic complaints. She is wondering if she has "adrenal fatigue". She does feel like stress is a big trigger for her. She does not have PCP. Her lipids from January showed an LDL of 92 and HLD of 67. She does not smoke. No HTN.   Past Medical History:  Diagnosis Date  . History of endometriosis   . Pelvic pain in female   . Rotator cuff syndrome 03/30/2012   This is now chronic for 7 months involving the left shoulder. I suspect at the onset of the injury she probably had a partial tear of her supraspinatous and infraspinatus that appear to have essentially healed but left her with some chronic tendinopathy.   . Shoulder pain, left 03/30/2012   This is chronic now for 7 months but does respond to ibuprofen     Past Surgical History:  Procedure Laterality Date  . CESAREAN SECTION  03-03-2006  . LAPAROSCOPY  N/A 08/29/2015   Procedure: LAPAROSCOPY DIAGNOSTIC , drainage of left ovarian cyst;  Surgeon: Ashlee Overlie, MD;  Location: [facility];  Service: Gynecology;  Laterality: N/A;  . LAPAROSCOPY W/ FULGERATION ENDOMETRIOSIS  10-02-2004     Medications: Current Meds  Medication Sig  . ALPRAZolam (XANAX) 0.5 MG tablet TAKE 1 TABLET EVERY 8 HOURS AS NEEDED FOR ANXIETY  . ibuprofen (ADVIL,MOTRIN) 200 MG tablet Take 200 mg by mouth every 6 (six) hours as needed.     Allergies: Allergies  Allergen Reactions  . Ampicillin Other (See Comments)    "Severe chest pain"  . Penicillins Other (See Comments)    Unknown childhood reaction    Social History: The patient  reports that she has never smoked. She has never used smokeless tobacco. She reports that she does not drink alcohol or use drugs.   Family History: The patient's family history includes Heart attack (age of onset: 73) in her father.   Review of Systems: Please see the history of present illness.   Otherwise, the review of systems is positive for none.   All other systems are reviewed and negative.   Physical Exam: VS:  BP 120/80 (BP Location: Left Arm, Patient Position: Sitting, Cuff Size: Normal)   Pulse (!) 55  Ht  (1.727 m)   Wt 145 lb 1.9 oz (65.8 kg)   BMI 22.07 kg/m  .  BMI Body mass index is 22.07 kg/m.  Wt Readings from Last 3 Encounters:  04/13/17 145 lb 1.9 oz (65.8 kg)  08/29/15 149 lb (67.6 kg)  03/30/12 140 lb (63.5 kg)    General: Pleasant. Well developed, well nourished and in no acute distress.   HEENT: Normal.  Neck: Supple, no JVD, carotid bruits, or masses noted.  Cardiac: Regular rate and rhythm. No murmurs, rubs, or gallops. No edema.  Respiratory:  Lungs are clear to auscultation bilaterally with normal work of breathing.  GI: Soft and nontender.  MS: No deformity or atrophy. Gait and ROM intact.  Skin: Warm and dry. Color is normal.  Neuro:  Strength and sensation are  intact and no gross focal deficits noted.  Psych: Alert, appropriate and with normal affect.   LABORATORY DATA:  EKG:  EKG is ordered today. This demonstrates sinus bradycardia - rate is 55. Reviewed with Dr. Mayford Knife (DOD).   Lab Results  Component Value Date   HGB 14.2 08/29/2015     BNP (last 3 results) No results for input(s): BNP in the last 8760 hours.  ProBNP (last 3 results) No results for input(s): PROBNP in the last 8760 hours.   Other Studies Reviewed Today:   Assessment/Plan: 1. Multiple somatic complaints. No clear angina. Only risk factor is her FH for early CAD.   2. Strongly +FH for early CAD - father with acute MI/death at age 97.    Will arrange for GXT. Would check CT with calcium score - if elevated would start statin therapy.   3. Stress/anxiety - discussed at length - needs to work on priorities and make herself a priority. Regular exercise also encouraged.   4. Fatigue - she is wondering if she has adrenal issues - will get her to PCP for possible work up.   Current medicines are reviewed with the patient today.  The patient does not have concerns regarding medicines other than what has been noted above.  The following changes have been made:  See above.  Labs/ tests ordered today include:    Orders Placed This Encounter  Procedures  . CT CARDIAC SCORING  . EKG 12-Lead  . EXERCISE TOLERANCE TEST (ETT)     Disposition:   FU prn. Would advise periodic stress testing in light of her FH.   Patient is agreeable to this plan and will call if any problems develop in the interim.   SignedNorma Fredrickson, NP  04/13/2017 9:00 AM  [facility] [address] [place], [place]  [address] Phone: [phone] Fax: [phone]

## 2017-04-13 ENCOUNTER — Encounter (INDEPENDENT_AMBULATORY_CARE_PROVIDER_SITE_OTHER): Payer: Self-pay

## 2017-04-13 ENCOUNTER — Ambulatory Visit (INDEPENDENT_AMBULATORY_CARE_PROVIDER_SITE_OTHER): Payer: No Typology Code available for payment source | Admitting: Nurse Practitioner

## 2017-04-13 ENCOUNTER — Other Ambulatory Visit: Payer: Self-pay | Admitting: Cardiovascular Disease

## 2017-04-13 ENCOUNTER — Ambulatory Visit (INDEPENDENT_AMBULATORY_CARE_PROVIDER_SITE_OTHER)
Admission: RE | Admit: 2017-04-13 | Discharge: 2017-04-13 | Disposition: A | Discharge status: Home / Self Care | Payer: Self-pay | Source: Ambulatory Visit | Attending: Nurse Practitioner | Admitting: Nurse Practitioner

## 2017-04-13 ENCOUNTER — Telehealth: Payer: Self-pay | Admitting: Nurse Practitioner

## 2017-04-13 ENCOUNTER — Encounter: Payer: Self-pay | Admitting: Nurse Practitioner

## 2017-04-13 VITALS — BP 120/80 | HR 55 | Ht 68.0 in | Wt 145.1 lb

## 2017-04-13 DIAGNOSIS — R079 Chest pain, unspecified: Secondary | ICD-10-CM | POA: Diagnosis not present

## 2017-04-13 NOTE — Telephone Encounter (Signed)
Called CT calcium scoring. Right pulmonary lung nodule. If low risk no f/u/ if high risk f/u CT w/o contrats in 12 months.

## 2017-04-13 NOTE — Telephone Encounter (Signed)
New message    Jari Pigg @ [facility] - [phone] Re: cardiac overread result

## 2017-04-13 NOTE — Patient Instructions (Addendum)
We will be checking the following labs today - NONE   Medication Instructions:    Continue with your current medicines.     Testing/Procedures To Be Arranged:  CT calcium scoring  GXT  Follow-Up:   Will see how your studies turn out - if ok - would recommend periodic stress testing every few years.      Other Special Instructions:   Think about what we talked about today.   [facility] - Dr. Larita Fife [phone]    If you need a refill on your cardiac medications before your next appointment, please call your pharmacy.   Call the [facility] office at [phone] if you have any questions, problems or concerns.

## 2017-04-16 ENCOUNTER — Telehealth: Payer: Self-pay | Admitting: Nurse Practitioner

## 2017-04-16 NOTE — Telephone Encounter (Signed)
New message    Patient wants to know if she is medically ok to wait until January to get stress test. Please advise.

## 2017-04-16 NOTE — Telephone Encounter (Signed)
This choice is up to her - but I would prefer that we go ahead - especially in light of having symptoms.

## 2017-04-16 NOTE — Telephone Encounter (Signed)
Will forward to Norma Fredrickson NP for advisement.

## 2017-04-19 NOTE — Telephone Encounter (Signed)
S/w pt is going to keep appt for gxt on Wednesday. Pt was concerned about the cost of test hasn't met deductible.  Stated would have to call insurance company and if does not get answer to call billing dept.

## 2017-04-21 ENCOUNTER — Ambulatory Visit (INDEPENDENT_AMBULATORY_CARE_PROVIDER_SITE_OTHER): Payer: No Typology Code available for payment source

## 2017-04-21 DIAGNOSIS — R079 Chest pain, unspecified: Secondary | ICD-10-CM

## 2017-04-21 LAB — EXERCISE TOLERANCE TEST
Estimated workload: 11.7 METS
Exercise duration (min): 10 min
Exercise duration (sec): 4 s
MPHR: 174 {beats}/min
Peak HR: 173 {beats}/min
Percent HR: 99 %
RPE: 17
Rest HR: 62 {beats}/min

## 2017-07-14 ENCOUNTER — Other Ambulatory Visit: Payer: Self-pay | Admitting: Obstetrics and Gynecology

## 2017-07-14 DIAGNOSIS — Z1231 Encounter for screening mammogram for malignant neoplasm of breast: Secondary | ICD-10-CM

## 2017-08-22 ENCOUNTER — Other Ambulatory Visit: Payer: Self-pay | Admitting: General Surgery

## 2017-08-22 DIAGNOSIS — N6320 Unspecified lump in the left breast, unspecified quadrant: Secondary | ICD-10-CM

## 2017-09-13 ENCOUNTER — Other Ambulatory Visit: Payer: Self-pay | Admitting: General Surgery

## 2018-10-12 ENCOUNTER — Other Ambulatory Visit: Payer: Self-pay

## 2018-10-12 ENCOUNTER — Encounter (HOSPITAL_COMMUNITY): Payer: Self-pay | Admitting: Family Medicine

## 2018-10-12 ENCOUNTER — Ambulatory Visit (HOSPITAL_COMMUNITY)
Admission: EM | Admit: 2018-10-12 | Discharge: 2018-10-12 | Disposition: A | Discharge status: Home / Self Care | Payer: No Typology Code available for payment source | Attending: Family Medicine | Admitting: Family Medicine

## 2018-10-12 ENCOUNTER — Ambulatory Visit (INDEPENDENT_AMBULATORY_CARE_PROVIDER_SITE_OTHER): Payer: No Typology Code available for payment source

## 2018-10-12 DIAGNOSIS — R509 Fever, unspecified: Secondary | ICD-10-CM | POA: Diagnosis not present

## 2018-10-12 DIAGNOSIS — R0789 Other chest pain: Secondary | ICD-10-CM

## 2018-10-12 DIAGNOSIS — R05 Cough: Secondary | ICD-10-CM

## 2018-10-12 DIAGNOSIS — R059 Cough, unspecified: Secondary | ICD-10-CM

## 2018-10-12 MED ORDER — HYDROCODONE-HOMATROPINE 5-1.5 MG/5ML PO SYRP: 5.0000 mL | ORAL_SOLUTION | Freq: Four times a day (QID) | ORAL | 0 refills | Status: DC | PRN

## 2018-10-12 NOTE — ED Triage Notes (Signed)
seen by provider prior to this nurse 

## 2018-10-13 NOTE — ED Provider Notes (Signed)
[facility]   [id number] 10/12/18 Arrival Time: 1918  ASSESSMENT & PLAN:  1. Cough   2. Feeling of chest tightness    I have personally viewed the imaging studies ordered this visit. No acute changes or signs of PNA.  Meds ordered this encounter  Medications  . HYDROcodone-homatropine (HYCODAN) 5-1.5 MG/5ML syrup    Sig: Take 5 mLs by mouth every 6 (six) hours as needed for cough.    Dispense:  90 mL    Refill:  0   Has just completed 10 days of doxycycline. Discussed possibility of lingering post-viral cough. Question silent acid reflux causing/worsening cough; discussed. Cough medication sedation precautions. Discussed typical duration of symptoms. OTC symptom care as needed. Ensure adequate fluid intake and rest. May f/u with PCP or here as needed or if not improving over the next several days.  Reviewed expectations re: course of current medical issues. Questions answered. Outlined signs and symptoms indicating need for more acute intervention. Patient verbalized understanding. After Visit Summary given.   SUBJECTIVE: History from: patient.  Ashlee Graham is a 48 y.o. female who presents with complaint of mild nasal congestion, post-nasal drainage, and a persistent dry cough; without sore throat. Onset abrupt, on/off over the past two weeks; with mild fatigue and without body aches. SOB: none. Wheezing: none. Fever: none reported. Has had e-visits x 2-3 with healthcare providers. Just finished 10 days of doxycycline. Overall normal PO intake without n/v. Known sick contacts: no. No specific or significant aggravating or alleviating factors reported. OTC treatment: various symptom care without much relief. No recent travel or exposure to COVID-19 reported.  Social History   Tobacco Use  Smoking Status Never Smoker  Smokeless Tobacco Never Used   ROS: As per HPI. All other systems negative.    OBJECTIVE:  Vitals:   10/12/18 2001  BP: 124/78  Pulse: 86   Temp: 98.1 F (36.7 C)  TempSrc: Oral  SpO2: 100%    General appearance: alert; appears fatigued; no idistress HEENT: moderate nasal congestion; clear runny nose; throat irritation secondary to post-nasal drainage Neck: supple without LAD CV: RRR Lungs: unlabored respirations, symmetrical air entry without wheezing; cough: moderate and non-productive Abd: soft Ext: no LE edema Skin: warm and dry Psychological: alert and cooperative; normal mood and affect  Imaging: Dg Chest 2 View  Result Date: 10/12/2018 CLINICAL DATA:  Persistent cough.  Recent fever. EXAM: CHEST - 2 VIEW COMPARISON:  CT chest dated April 13, 2017. FINDINGS: The heart size and mediastinal contours are within normal limits. Both lungs are clear. The visualized skeletal structures are unremarkable. IMPRESSION: No active cardiopulmonary disease. Electronically Signed   By: Obie Dredge M.D.   On: 10/12/2018 20:04    Allergies  Allergen Reactions  . Ampicillin Other (See Comments)    "Severe chest pain"  . Penicillins Other (See Comments)    Unknown childhood reaction    Past Medical History:  Diagnosis Date  . History of endometriosis   . Pelvic pain in female   . Rotator cuff syndrome 03/30/2012   This is now chronic for 7 months involving the left shoulder. I suspect at the onset of the injury she probably had a partial tear of her supraspinatous and infraspinatus that appear to have essentially healed but left her with some chronic tendinopathy.   . Shoulder pain, left 03/30/2012   This is chronic now for 7 months but does respond to ibuprofen    Family History  Problem Relation Age of  Onset  . Heart attack Father 18       deceased   Social History   Socioeconomic History  . Marital status: Married    Spouse name: Not on file  . Number of children: Not on file  . Years of education: Not on file  . Highest education level: Not on file  Occupational History  . Not on file  Social Needs  .  Financial resource strain: Not on file  . Food insecurity:    Worry: Not on file    Inability: Not on file  . Transportation needs:    Medical: Not on file    Non-medical: Not on file  Tobacco Use  . Smoking status: Never Smoker  . Smokeless tobacco: Never Used  Substance and Sexual Activity  . Alcohol use: No  . Drug use: No  . Sexual activity: Not on file  Lifestyle  . Physical activity:    Days per week: Not on file    Minutes per session: Not on file  . Stress: Not on file  Relationships  . Social connections:    Talks on phone: Not on file    Gets together: Not on file    Attends religious service: Not on file    Active member of club or organization: Not on file    Attends meetings of clubs or organizations: Not on file    Relationship status: Not on file  . Intimate partner violence:    Fear of current or ex partner: Not on file    Emotionally abused: Not on file    Physically abused: Not on file    Forced sexual activity: Not on file  Other Topics Concern  . Not on file  Social History Narrative  . Not on file           Mardella Layman, MD 10/13/18 [phone]

## 2019-08-08 ENCOUNTER — Telehealth: Payer: Self-pay | Admitting: Nurse Practitioner

## 2019-08-08 NOTE — Telephone Encounter (Signed)
This was given to medical records to get verbal consent from pt to receive theses records. Med rec's stated takes 10-14 business days.

## 2019-08-08 NOTE — Telephone Encounter (Signed)
Patient calling stating she would like to get her CT results and office notes from her last visit. She would also like a reccomemdation for a PCP.

## 2019-08-10 ENCOUNTER — Other Ambulatory Visit: Payer: Self-pay | Admitting: Obstetrics and Gynecology

## 2019-08-10 DIAGNOSIS — R928 Other abnormal and inconclusive findings on diagnostic imaging of breast: Secondary | ICD-10-CM

## 2019-08-11 ENCOUNTER — Ambulatory Visit
Admission: RE | Admit: 2019-08-11 | Discharge: 2019-08-11 | Disposition: A | Discharge status: Home / Self Care | Payer: No Typology Code available for payment source | Source: Ambulatory Visit | Attending: Obstetrics and Gynecology | Admitting: Obstetrics and Gynecology

## 2019-08-11 ENCOUNTER — Other Ambulatory Visit: Payer: Self-pay

## 2019-08-11 DIAGNOSIS — R928 Other abnormal and inconclusive findings on diagnostic imaging of breast: Secondary | ICD-10-CM

## 2020-01-02 NOTE — Progress Notes (Signed)
CARDIOLOGY OFFICE NOTE  Date:  01/09/2020    Ashlee Graham Date of Birth: February 06, 1971 Medical Record #[id number]  PCP:  Marcelle Overlie, MD  Cardiologist:  Mayford Knife   Chief Complaint  Patient presents with  . Follow-up    History of Present Illness: Ashlee Graham is a 49 y.o. female who presents today for a follow up visit. Seen for Dr. Mayford Knife (DOD).   She has no known CAD.   Was seen here back in October of 2018 for +FH of early CAD and multiple somatic complaints. Her father died in his early 31's with MI.  Multiple other family members on her dad's side have had early CAD/MI. Mother is alive - has had breast cancer. Lots of stress with work. Some fleeting chest pain. Does not smoke.   GXT and calcium scoring was recommended and these studies turned out ok. Calcium score is 0. There was a lung nodule noted - CT was recommended.   The patient does not have symptoms concerning for COVID-19 infection (fever, chills, cough, or new shortness of breath).   Comes in today. Here alone. She is asking about the CT scan - this was not done. Notes lots of stress. Mostly with her job. Has had some fluttering in her chest - has noted elevated HR - in the 100's - which is not typical for her - usually in the 60's. This is a one time episode that happened and has not recurred.  More anxiety that she notes. Using less Xanax on purpose. Walking some - about 2 miles a few times a week but nothing intense like she has in the past. Lipids seemed ok. She is really on no medicines. No excessive caffeine.   Past Medical History:  Diagnosis Date  . History of endometriosis   . Pelvic pain in female   . Rotator cuff syndrome 03/30/2012   This is now chronic for 7 months involving the left shoulder. I suspect at the onset of the injury she probably had a partial tear of her supraspinatous and infraspinatus that appear to have essentially healed but left her with some chronic tendinopathy.   .  Shoulder pain, left 03/30/2012   This is chronic now for 7 months but does respond to ibuprofen     Past Surgical History:  Procedure Laterality Date  . CESAREAN SECTION  03-03-2006  . LAPAROSCOPY N/A 08/29/2015   Procedure: LAPAROSCOPY DIAGNOSTIC , drainage of left ovarian cyst;  Surgeon: Marcelle Overlie, MD;  Location: [facility];  Service: Gynecology;  Laterality: N/A;  . LAPAROSCOPY W/ FULGERATION ENDOMETRIOSIS  10-02-2004     Medications: Current Meds  Medication Sig  . diphenhydrAMINE (BENADRYL ALLERGY) 25 mg capsule Benadryl  . ibuprofen (ADVIL,MOTRIN) 200 MG tablet Take 200 mg by mouth every 6 (six) hours as needed.     Allergies: Allergies  Allergen Reactions  . Ampicillin Other (See Comments)    "Severe chest pain"  . Penicillins Other (See Comments)    Unknown childhood reaction    Social History: The patient  reports that she has never smoked. She has never used smokeless tobacco. She reports that she does not drink alcohol and does not use drugs.   Family History: The patient's family history includes Heart attack (age of onset: 44) in her father.   Review of Systems: Please see the history of present illness.   All other systems are reviewed and negative.   Physical Exam: VS:  BP 106/82  Pulse 65   Ht 5\' 8"  (1.727 m)   Wt 140 lb 3.2 oz (63.6 kg)   SpO2 98%   BMI 21.32 kg/m  .  BMI Body mass index is 21.32 kg/m.  Wt Readings from Last 3 Encounters:  01/09/20 140 lb 3.2 oz (63.6 kg)  04/13/17 145 lb 1.9 oz (65.8 kg)  08/29/15 149 lb (67.6 kg)    General: Alert and in no acute distress. Weight is down.   HEENT: Normal.  Neck: Supple, no JVD, carotid bruits, or masses noted.  Cardiac: Regular rate and rhythm. No murmurs, rubs, or gallops. No edema.  Respiratory:  Lungs are clear to auscultation bilaterally with normal work of breathing.  GI: Soft and nontender.  MS: No deformity or atrophy. Gait and ROM intact.  Skin: Warm and  dry. Color is normal.  Neuro:  Strength and sensation are intact and no gross focal deficits noted.  Psych: Alert, appropriate and with normal affect.   LABORATORY DATA:  EKG:  EKG is ordered today.  Personally reviewed by me. This demonstrates NSR - HR is 65 - no acute changes.  Lab Results  Component Value Date   HGB 14.2 08/29/2015       BNP (last 3 results) No results for input(s): BNP in the last 8760 hours.  ProBNP (last 3 results) No results for input(s): PROBNP in the last 8760 hours.   Other Studies Reviewed Today:  GXT Study Result 04/2017    There was no ST segment deviation noted during stress.    1. Excellent exercise tolerance.  2. No evidence for ischemia by ST segment analysis.     CALCIUM SCORING IMPRESSION 04/2017: Coronary calcium score of 0.   [place] [place]   Assessment/Plan:  1. Lone episode of elevated HR - has not recurred - she feels this is related to being anxious. If recurs would consider monitor. For now, would hold on medicines.   2. Early history of CAD in her family - needs aggressive risk factor modification.   3. Anxiety - she knows this is an issue - discussed at length today. Hard to make herself a priority.   4. Lung nodule - noted off her prior CT for calcium score - she is asking for this to be rechecked. Will arrange for non contrast CT.   Current medicines are reviewed with the patient today.  The patient does not have concerns regarding medicines other than what has been noted above.  The following changes have been made:  See above.  Labs/ tests ordered today include:    Orders Placed This Encounter  Procedures  . CT Chest Wo Contrast  . CT CARDIAC SCORING  . EKG 12-Lead     Disposition:   FU with [place] as needed. Suggestions given for primary care. Will update her calcium score and get non contrast Ct to follow up on lung nodule.     Patient is agreeable to this plan and will call if any problems develop  in the interim.   SignedTruitt Merle, NP  01/09/2020 8:58 AM  [facility] [address] [place], [place]  [address] Phone: [phone] Fax: [phone]

## 2020-01-09 ENCOUNTER — Ambulatory Visit (INDEPENDENT_AMBULATORY_CARE_PROVIDER_SITE_OTHER): Payer: Self-pay | Admitting: Nurse Practitioner

## 2020-01-09 ENCOUNTER — Encounter: Payer: Self-pay | Admitting: Nurse Practitioner

## 2020-01-09 ENCOUNTER — Other Ambulatory Visit: Payer: Self-pay

## 2020-01-09 VITALS — BP 106/82 | HR 65 | Ht 68.0 in | Wt 140.2 lb

## 2020-01-09 DIAGNOSIS — Z8249 Family history of ischemic heart disease and other diseases of the circulatory system: Secondary | ICD-10-CM

## 2020-01-09 DIAGNOSIS — R911 Solitary pulmonary nodule: Secondary | ICD-10-CM

## 2020-01-09 NOTE — Patient Instructions (Addendum)
After Visit Summary:  We will be checking the following labs today - NONE   Medication Instructions:    Continue with your current medicines.    If you need a refill on your cardiac medications before your next appointment, please call your pharmacy.     Testing/Procedures To Be Arranged:  CT chest without contrast to follow up lung nodule seen in 2018  Calcium scoring CT  Follow-Up:   See [place] back as needed.     At [facility], you and your health needs are our priority.  As part of our continuing mission to provide you with exceptional heart care, we have created designated Provider Care Teams.  These Care Teams include your primary Cardiologist (physician) and Advanced Practice Providers (APPs -  Physician Assistants and Nurse Practitioners) who all work together to provide you with the care you need, when you need it.  Special Instructions:  . Stay safe, wash your hands for at least 20 seconds and wear a mask when needed.  . It was good to talk with you today.  . If you have more spells of fast heart beating - let me know - we will place a monitor . Think about what we talked about today.  [facility] or [facility] would be options to try for for primary care.   Call the [facility] office at [phone] if you have any questions, problems or concerns.

## 2020-01-16 ENCOUNTER — Ambulatory Visit (INDEPENDENT_AMBULATORY_CARE_PROVIDER_SITE_OTHER)
Admission: RE | Admit: 2020-01-16 | Discharge: 2020-01-16 | Disposition: A | Discharge status: Home / Self Care | Payer: No Typology Code available for payment source | Source: Ambulatory Visit | Attending: Nurse Practitioner | Admitting: Nurse Practitioner

## 2020-01-16 ENCOUNTER — Ambulatory Visit
Admission: RE | Admit: 2020-01-16 | Discharge: 2020-01-16 | Disposition: A | Discharge status: Home / Self Care | Payer: Self-pay | Source: Ambulatory Visit | Attending: Nurse Practitioner | Admitting: Nurse Practitioner

## 2020-01-16 ENCOUNTER — Other Ambulatory Visit: Payer: Self-pay

## 2020-01-16 DIAGNOSIS — R911 Solitary pulmonary nodule: Secondary | ICD-10-CM

## 2020-01-16 DIAGNOSIS — Z8249 Family history of ischemic heart disease and other diseases of the circulatory system: Secondary | ICD-10-CM

## 2020-04-04 ENCOUNTER — Telehealth: Payer: Self-pay | Admitting: Internal Medicine

## 2020-04-04 NOTE — Telephone Encounter (Signed)
Pt is calling stating that she spoke with Dr. Fabian Sharp about taking her as newpt and was told that her panel is full and to check to see if Dr. Hassan Rowan is accepting new patients at this present time Dr. Hassan Rowan is not accepting any newpts and pt would like to see if Dr. Fabian Sharp would consider accepting her as a newpt she is aware that Dr. Fabian Sharp is not accepting newpts.  Dr. Fabian Sharp are you willing to accept this pt?

## 2020-04-05 NOTE — Telephone Encounter (Signed)
Please see message.  Please advise. 

## 2021-07-03 IMAGING — CT CT CARDIAC CORONARY ARTERY CALCIUM SCORE
3 series · 14 of 20 positions shown, 15 images · non-contrast
Comparison: None.
COMPARISON: None.

Addendum:
EXAM:
OVER-READ INTERPRETATION  CT CHEST

The following report is an over-read performed by radiologist Dr.
Wojtek Gavarrete [REDACTED] on 01/16/2020. This
over-read does not include interpretation of cardiac or coronary
anatomy or pathology. The coronary calcium score interpretation by
the cardiologist is attached.
CLINICAL DATA: Risk stratification
Coronary Calcium Score
TECHNIQUE: The patient was scanned on a Siemens Force scanner. Axial
non-contrast 3 mm slices were carried out through the heart. The
data set was analyzed on a dedicated work station and scored using
the Agatson method.

[Series 2: casc 3.0 bv41 2 bestdiast 71 % · axial · 0.28mm/px · z∈[-227,-146]mm · 4 of 47 slices shown, 5 images]
[im 10/47  vessel]
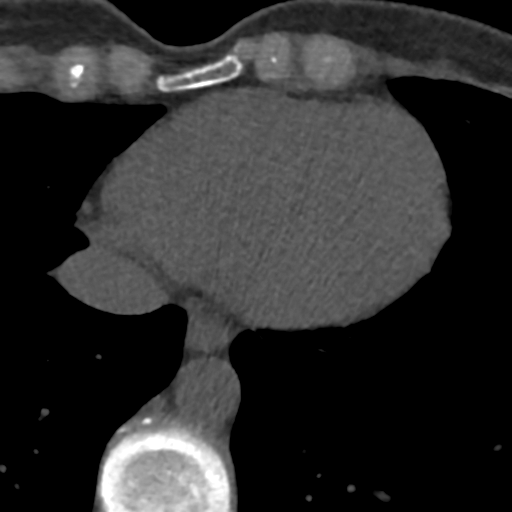
[im 10/47  lung]
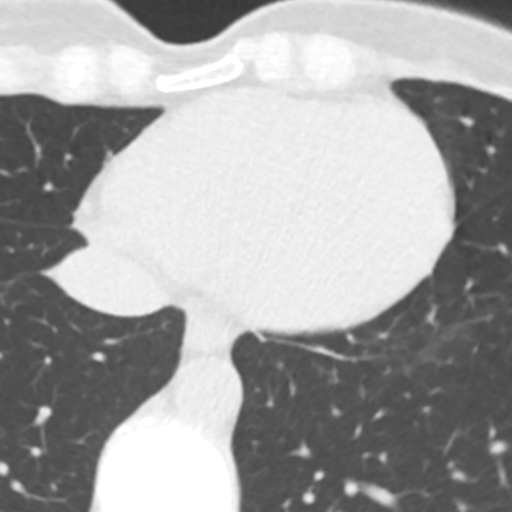
[im 19/47  vessel]
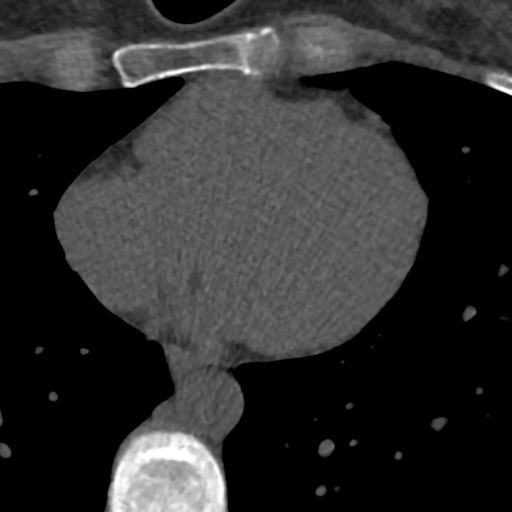
[im 28/47  vessel]
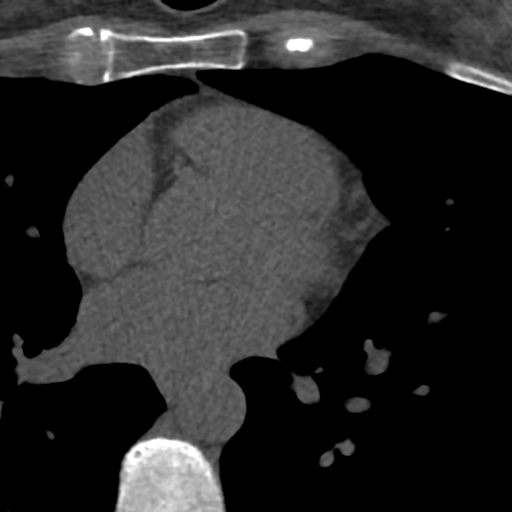
[im 37/47  vessel]
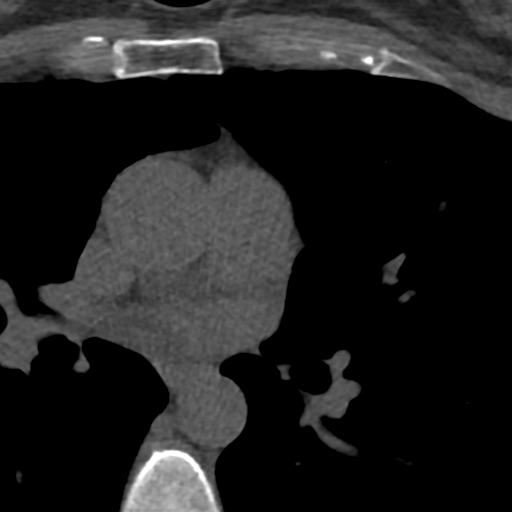

[Series 3: lung 71 % · axial · 0.60mm/px · z∈[-236,-140]mm · 5 of 48 slices shown]
[im 8/48  lung]
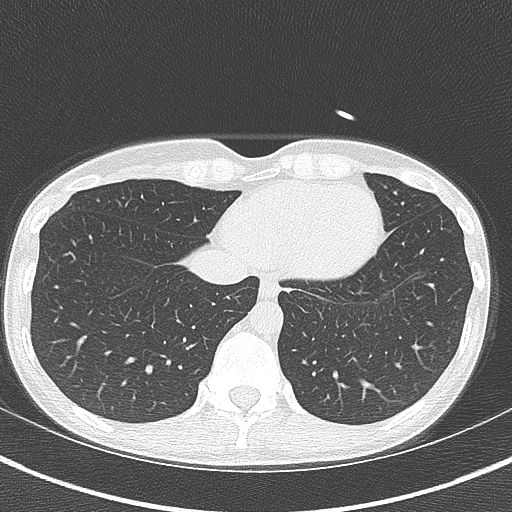
[im 16/48  lung]
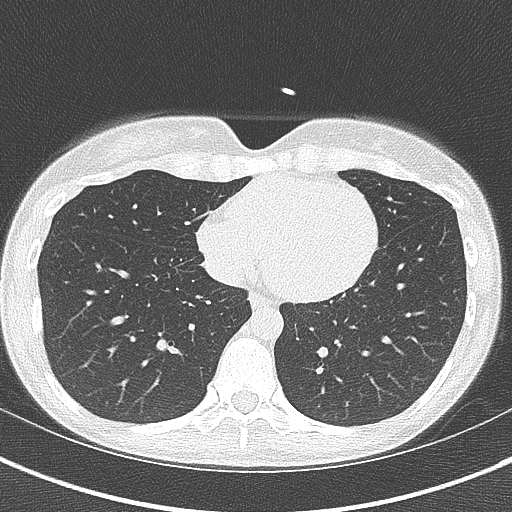
[im 24/48  lung]
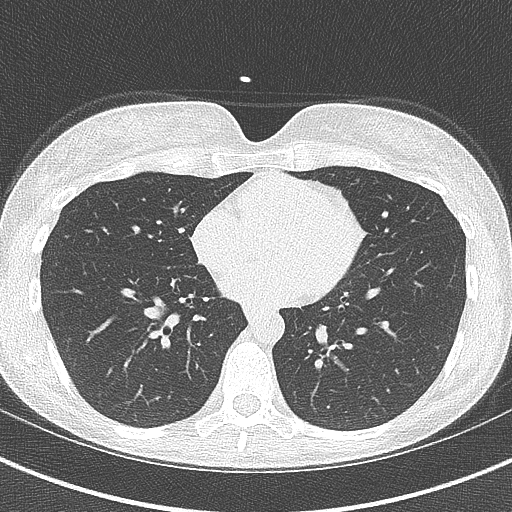
[im 32/48  lung]
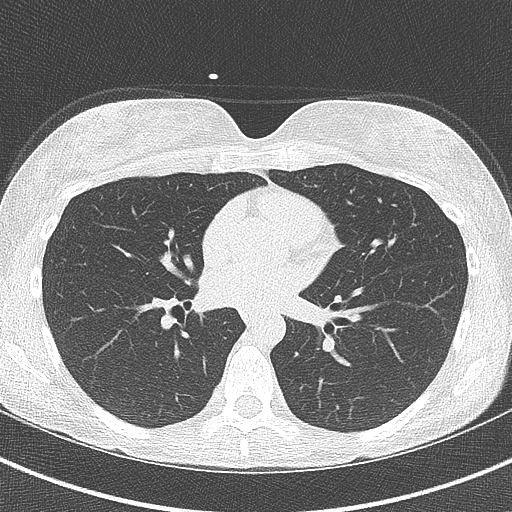
[im 40/48  lung]
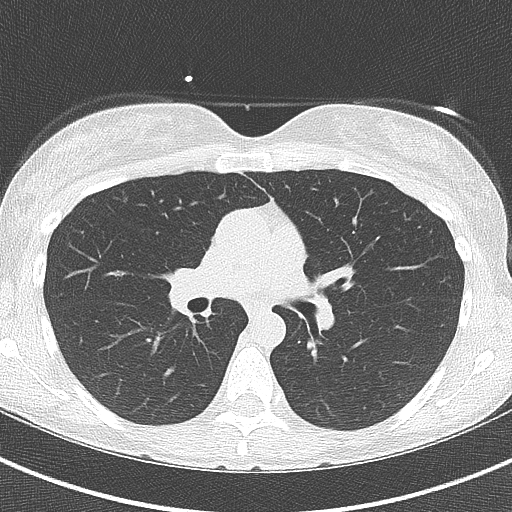

[Series 4: lung st 71 % · axial · 0.60mm/px · z∈[-236,-140]mm · 5 of 48 slices shown]
[im 8/48  lung]
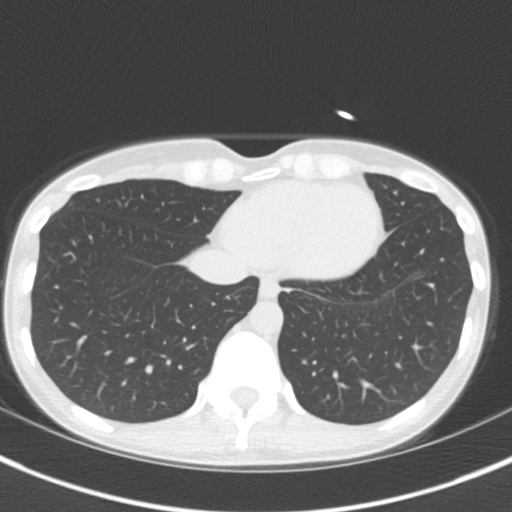
[im 16/48  lung]
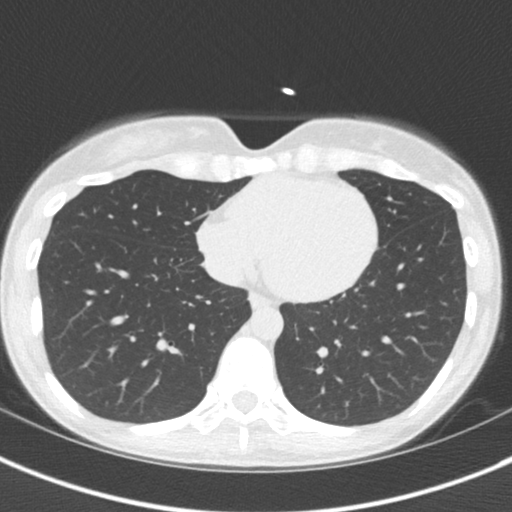
[im 24/48  lung]
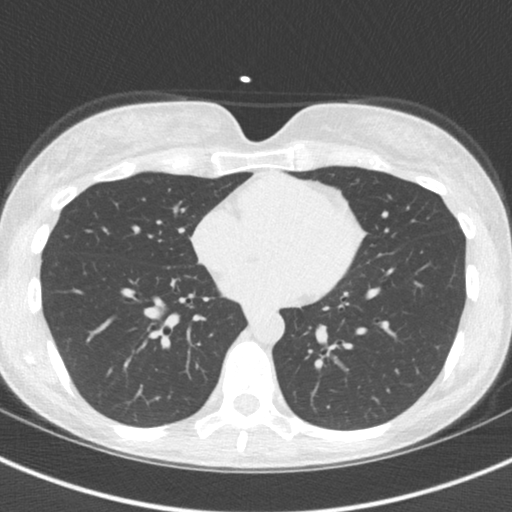
[im 32/48  lung]
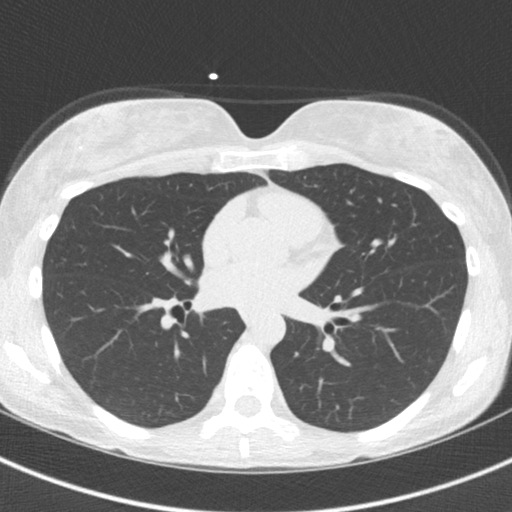
[im 40/48  lung]
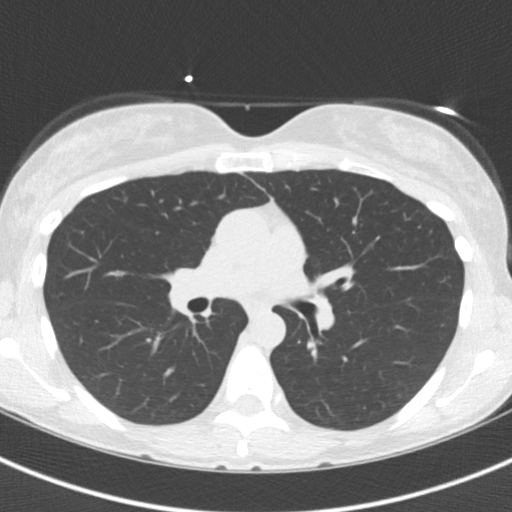

[14 of 20 positions shown; findings below may reference images not displayed]

FINDINGS: Within the visualized portions of the thorax there are no suspicious
appearing pulmonary nodules or masses, there is no acute
consolidative airspace disease, no pleural effusions, no
pneumothorax and no lymphadenopathy. Visualized portions of the
upper abdomen are unremarkable. There are no aggressive appearing
lytic or blastic lesions noted in the visualized portions of the
skeleton.
IMPRESSION: No significant incidental noncardiac findings are noted.
FINDINGS: Non-cardiac: See separate report from [REDACTED].

Ascending Aorta: Normal size, no calcifications.

Pericardium: Normal.

Coronary arteries: Normal origin.
IMPRESSION: Coronary calcium score of 0. This was 0 percentile for age and sex
matched control.

*** End of Addendum ***
EXAM:
OVER-READ INTERPRETATION  CT CHEST

The following report is an over-read performed by radiologist Dr.
Wojtek Gavarrete [REDACTED] on 01/16/2020. This
over-read does not include interpretation of cardiac or coronary
anatomy or pathology. The coronary calcium score interpretation by
the cardiologist is attached.
FINDINGS: Within the visualized portions of the thorax there are no suspicious
appearing pulmonary nodules or masses, there is no acute
consolidative airspace disease, no pleural effusions, no
pneumothorax and no lymphadenopathy. Visualized portions of the
upper abdomen are unremarkable. There are no aggressive appearing
lytic or blastic lesions noted in the visualized portions of the
skeleton.
IMPRESSION: No significant incidental noncardiac findings are noted.

## 2021-07-30 ENCOUNTER — Ambulatory Visit (INDEPENDENT_AMBULATORY_CARE_PROVIDER_SITE_OTHER): Payer: Self-pay | Admitting: Nurse Practitioner

## 2021-07-30 DIAGNOSIS — Z91199 Patient's noncompliance with other medical treatment and regimen due to unspecified reason: Secondary | ICD-10-CM

## 2021-07-30 NOTE — Progress Notes (Signed)
No Show for Appointment

## 2021-08-08 ENCOUNTER — Encounter (HOSPITAL_BASED_OUTPATIENT_CLINIC_OR_DEPARTMENT_OTHER): Payer: Self-pay | Admitting: Nurse Practitioner

## 2021-11-19 ENCOUNTER — Other Ambulatory Visit: Payer: Self-pay | Admitting: Orthopedic Surgery

## 2021-11-19 DIAGNOSIS — M542 Cervicalgia: Secondary | ICD-10-CM

## 2021-11-20 ENCOUNTER — Ambulatory Visit
Admission: RE | Admit: 2021-11-20 | Discharge: 2021-11-20 | Disposition: A | Discharge status: Home / Self Care | Payer: No Typology Code available for payment source | Source: Ambulatory Visit | Attending: Nurse Practitioner | Admitting: Nurse Practitioner

## 2021-11-20 ENCOUNTER — Other Ambulatory Visit: Payer: Self-pay | Admitting: Nurse Practitioner

## 2021-11-20 DIAGNOSIS — M545 Low back pain, unspecified: Secondary | ICD-10-CM

## 2021-11-20 DIAGNOSIS — R062 Wheezing: Secondary | ICD-10-CM

## 2021-12-04 DIAGNOSIS — M5030 Other cervical disc degeneration, unspecified cervical region: Secondary | ICD-10-CM | POA: Insufficient documentation

## 2022-10-30 ENCOUNTER — Telehealth: Payer: Self-pay

## 2022-10-30 NOTE — Telephone Encounter (Signed)
Called patient in regards to her appointment, Was unable to reach her [facility].  Also called the office of the referring provider and they had no record of referring her to our office.   I will wait for the patient to give [place] a call back or try to call her again before the ending of the day.

## 2022-10-30 NOTE — Telephone Encounter (Signed)
-----   Message from Ashlee Levering, NP sent at 10/30/2022  7:24 AM EDT ----- Hey!  Will you look into this please? This patient has never been seen by a cardiologist. She saw an NP three times (last in June 2021). Does she need to see an actual cardiologist? I do not care if she stays on my schedule, I just want to make sure.   Thank you!  DW

## 2022-10-30 NOTE — Progress Notes (Deleted)
   Cardiology Clinic Note   Date: 10/30/2022 ID: [name], DOB 08/27/1970, MRN [id number]  Primary Cardiologist:  None  Patient Profile    Chena Chohan is a 52 y.o. female who presents to the clinic today for ***  Past medical history significant for: Atypical chest pain.  CT cardiac scoring 04/13/2017 and 01/16/2020: Calcium score 0. Exercise stress test 04/21/2017: Normal stress test.  Family history of early CAD.  Father with MI/death age 38.   History of Present Illness    Yazhini Mcaulay was first evaluated by Norma Fredrickson, NP on 04/13/2017 for atypical chest pain. She underwent calcium scoring at that time with 0 score and normal exercise stress test. She had repeat cardiac scoring in July 2021 which was also zero.   She was last seen in the office by Norma Fredrickson, NP on 01/09/2020 with complaints of fluttering in her chest and elevated heart rate in the 100s (normally in the 60s). She described a single episode. She reported an increased amount of stress related to her job.  Today, patient ***    ROS: All other systems reviewed and are otherwise negative except as noted in History of Present Illness.  Studies Reviewed    ECG personally reviewed by me today: ***  No significant changes from ***  Risk Assessment/Calculations    {Does this patient have ATRIAL FIBRILLATION?:[phone]} No BP recorded.  {Refresh Note OR Click here to enter BP  :1}***        Physical Exam    VS:  There were no vitals taken for this visit. , BMI There is no height or weight on file to calculate BMI.  GEN: Well nourished, well developed, in no acute distress. Neck: No JVD or carotid bruits. Cardiac: *** RRR. No murmurs. No rubs or gallops.   Respiratory:  Respirations regular and unlabored. Clear to auscultation without rales, wheezing or rhonchi. GI: Soft, nontender, nondistended. Extremities: Radials/DP/PT 2+ and equal bilaterally. No clubbing or cyanosis. No edema ***   Skin: Warm and dry, no rash. Neuro: Strength intact.  Assessment & Plan   ***  Disposition: ***     {Are you ordering a CV Procedure (e.g. stress test, cath, DCCV, TEE, etc)?   Press F2        :[id number]}   Signed, Etta Grandchild. Kevona Lupinacci, DNP, NP-C

## 2022-11-03 ENCOUNTER — Ambulatory Visit: Payer: No Typology Code available for payment source | Admitting: Student

## 2022-11-03 ENCOUNTER — Telehealth: Payer: Self-pay | Admitting: Cardiology

## 2022-11-03 NOTE — Telephone Encounter (Signed)
Patient has an appt on 12/24/22 with Dr Shari Prows and has [employer]. I am pretty sure this insurance is in network but just want to confirm. She wants to make sure that it is in network with [place] and so she will know her copay/responsibility. Please give her a call with this information.

## 2022-11-11 NOTE — Progress Notes (Unsigned)
Cardiology Office Note:   Date:  11/12/2022  ID:  Ashlee Graham, DOB February 21, 1971, MRN [id number]  History of Present Illness:   Ashlee Graham is a 52 y.o. female with family history of early CAD who previously followed with Dr. Mayford Knife who now presents to clinic for follow-up.  Per review of the record, patient with history of chest pain. GXT in 2018 normal. Ca score 0.  Today, the patient states she is overall doing well. States that she has been having tightness in her lungs/chest when trying to take a deep breath as well as chronic cough with sputum production. She is interested in seeing a Pulmonologist to assess further.  No exertional chest pain or DOE. Her fatigue has overall improved. No LE edema, orthopnea or PND. She is able to walk and yoga without issues.    Past Medical History:  Diagnosis Date   History of endometriosis    Pelvic pain in female    Rotator cuff syndrome 03/30/2012   This is now chronic for 7 months involving the left shoulder. I suspect at the onset of the injury she probably had a partial tear of her supraspinatous and infraspinatus that appear to have essentially healed but left her with some chronic tendinopathy.    Shoulder pain, left 03/30/2012   This is chronic now for 7 months but does respond to ibuprofen      ROS: As per HPI  Studies Reviewed:    EKG:  NSR with HR 72  Cardiac Studies & Procedures     STRESS TESTS  EXERCISE TOLERANCE TEST (ETT) 04/22/2017  Narrative  There was no ST segment deviation noted during stress.  1. Excellent exercise tolerance. 2. No evidence for ischemia by ST segment analysis.      CT SCANS  CT CARDIAC SCORING (SELF PAY ONLY) 01/22/2020  Addendum 01/22/2020  6:37 PM ADDENDUM REPORT: 01/22/2020 18:34  CLINICAL DATA:  Risk stratification  EXAM: Coronary Calcium Score  TECHNIQUE: The patient was scanned on a [employer] scanner. Axial non-contrast 3 mm slices were carried out through the heart.  The data set was analyzed on a dedicated work station and scored using the Agatson method.  FINDINGS: Non-cardiac: See separate report from [facility].  Ascending Aorta: Normal size, no calcifications.  Pericardium: Normal.  Coronary arteries: Normal origin.  IMPRESSION: Coronary calcium score of 0. This was 0 percentile for age and sex matched control.   Electronically Signed By: Tobias Alexander On: 01/22/2020 18:34  Narrative EXAM: OVER-READ INTERPRETATION  CT CHEST  The following report is an over-read performed by radiologist Dr. Trudie Reed of [facility] on 01/16/2020. This over-read does not include interpretation of cardiac or coronary anatomy or pathology. The coronary calcium score interpretation by the cardiologist is attached.  COMPARISON:  None.  FINDINGS: Within the visualized portions of the thorax there are no suspicious appearing pulmonary nodules or masses, there is no acute consolidative airspace disease, no pleural effusions, no pneumothorax and no lymphadenopathy. Visualized portions of the upper abdomen are unremarkable. There are no aggressive appearing lytic or blastic lesions noted in the visualized portions of the skeleton.  IMPRESSION: No significant incidental noncardiac findings are noted.  Electronically Signed: By: Trudie Reed M.D. On: 01/16/2020 15:12   CT SCANS  CT CARDIAC SCORING (SELF PAY ONLY) 04/13/2017  Addendum 04/13/2017 11:27 AM ADDENDUM REPORT: 04/13/2017 11:24  CLINICAL DATA:  Risk stratification  EXAM: Coronary Calcium Score  TECHNIQUE: The patient was scanned on a  [employer] Somatom 64 slice scanner. Axial non-contrast 3 mm slices were carried out through the heart. The data set was analyzed on a dedicated work station and scored using the Agatson method.  FINDINGS: Non-cardiac: See separate report from [facility].  Ascending Aorta:  Normal 3.1 cm  Pericardium:  Normal  Coronary arteries:  No calcium detected  IMPRESSION: Coronary calcium score of 0.  Charlton Haws   Electronically Signed By: Charlton Haws M.D. On: 04/13/2017 11:24  Narrative EXAM: OVER-READ INTERPRETATION  CT CHEST  The following report is an over-read performed by radiologist Dr. Maryelizabeth Rowan [facility] on 04/13/2017. This over-read does not include interpretation of cardiac or coronary anatomy or pathology. The coronary calcium score interpretation by the cardiologist is attached.  COMPARISON:  None.  FINDINGS: Limited view of the lung parenchyma demonstrates early peripheral nodule in the RIGHT middle lobe measuring 4 mm (image 17, series 4).  Limited view of the mediastinum demonstrates no adenopathy. Esophagus normal.  Limited view of the upper abdomen unremarkable.  Limited view of the skeleton and chest wall is unremarkable.  IMPRESSION: Small RIGHT middle lobe pulmonary nodule. No follow-up needed if patient is low-risk. Non-contrast chest CT can be considered in 12 months if patient is high-risk. This recommendation follows the consensus statement: Guidelines for Management of Incidental Pulmonary Nodules Detected on CT Images: From the Fleischner Society 2017; Radiology 2017; 284:228-243.  These results will be called to the ordering clinician or representative by the Radiologist Assistant, and communication documented in the PACS or zVision Dashboard.  Electronically Signed: By: Genevive Bi M.D. On: 04/13/2017 09:52           Risk Assessment/Calculations:              Physical Exam:   VS:  BP 112/78   Pulse 72   Ht 5\' 8"  (1.727 m)   Wt 155 lb (70.3 kg)   SpO2 98%   BMI 23.57 kg/m    Wt Readings from Last 3 Encounters:  11/12/22 155 lb (70.3 kg)  01/09/20 140 lb 3.2 oz (63.6 kg)  04/13/17 145 lb 1.9 oz (65.8 kg)     GEN: Well nourished, well developed in no acute distress NECK: No JVD; No carotid  bruits CARDIAC: RRR, no murmurs, rubs, gallops RESPIRATORY:  Clear to auscultation without rales, wheezing or rhonchi  ABDOMEN: Soft, non-tender, non-distended EXTREMITIES:  No edema; No deformity   ASSESSMENT AND PLAN:   #Chronic Cough: #Sputum Production: -Patient with chronic cough with sputum production as well as episodes of SOB/chest tightness at rest -No known history of asthma but had a lot of second hand smoke exposure as a child -Symptoms do not sound cardiac in nature -Requesting referral to Pulm for further evaluation  #Family History of Premature CAD: #History of atypical chest pain -Ca score in 2021 was 0 -GXT in 2018 without ischemia -Continue aggressive lifestyle modifications         Signed, Meriam Sprague, MD

## 2022-11-12 ENCOUNTER — Ambulatory Visit: Payer: No Typology Code available for payment source | Attending: Cardiology | Admitting: Cardiology

## 2022-11-12 ENCOUNTER — Encounter: Payer: Self-pay | Admitting: Cardiology

## 2022-11-12 VITALS — BP 112/78 | HR 72 | Ht 68.0 in | Wt 155.0 lb

## 2022-11-12 DIAGNOSIS — R053 Chronic cough: Secondary | ICD-10-CM

## 2022-11-12 DIAGNOSIS — Z8249 Family history of ischemic heart disease and other diseases of the circulatory system: Secondary | ICD-10-CM | POA: Diagnosis not present

## 2022-11-12 NOTE — Patient Instructions (Signed)
Medication Instructions:   Your physician recommends that you continue on your current medications as directed. Please refer to the Current Medication list given to you today.  *If you need a refill on your cardiac medications before your next appointment, please call your pharmacy*   You have been referred to [facility] PULMONOLOGY FOR CHRONIC COUGH    Follow-Up: At [facility], you and your health needs are our priority.  As part of our continuing mission to provide you with exceptional heart care, we have created designated Provider Care Teams.  These Care Teams include your primary Cardiologist (physician) and Advanced Practice Providers (APPs -  Physician Assistants and Nurse Practitioners) who all work together to provide you with the care you need, when you need it.  We recommend signing up for the patient portal called "MyChart".  Sign up information is provided on this After Visit Summary.  MyChart is used to connect with patients for Virtual Visits (Telemedicine).  Patients are able to view lab/test results, encounter notes, upcoming appointments, etc.  Non-urgent messages can be sent to your provider as well.   To learn more about what you can do with MyChart, go to [email or web address].    Your next appointment:   2 year(s)  Provider:   DR. Shari Prows

## 2022-12-24 ENCOUNTER — Ambulatory Visit: Payer: No Typology Code available for payment source | Admitting: Cardiology

## 2023-01-12 ENCOUNTER — Ambulatory Visit: Payer: No Typology Code available for payment source | Admitting: Sports Medicine

## 2023-01-12 VITALS — BP 118/84 | Ht 68.0 in | Wt 155.0 lb

## 2023-01-12 DIAGNOSIS — M357 Hypermobility syndrome: Secondary | ICD-10-CM | POA: Insufficient documentation

## 2023-01-12 NOTE — Assessment & Plan Note (Signed)
I am unsure if some of her generalized musculoskeletal aches are related to menopausal change For musculoskeletal pain increased a lot when she started having menopausal symptoms She is now having hot flashes She has had about 10 pounds of weight gain  She does not meet the criteria for Ehlers-Danlos syndrome which has an associated myalgia Generalized hypermobility syndrome does not generally cause significant pain She tells me she has had elevated Epstein-Barr virus titers if so she could have a myalgia associated with chronic myelo encephalitic myalgia but I will need to see the levels of the titers before I would consider this likely Since she has not had chronic fatigue for years this also makes this unlikely  I suggested a consult with Dr. Milton Ferguson about her menopausal changes and see if this might be a factor in her musculoskeletal pain  She will return to me for her right shoulder and her sternoclavicular joint and I will assess this with ultrasound

## 2023-01-12 NOTE — Progress Notes (Signed)
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 However she does not have the other associated features Ehlers-Danlos with no POTS syndrome autonomic dysfunction migraines but does have fairly lax skin  Both knees hurt with the right was more on the medial side Her right sternoclavicular joint hurts She gets some pain periodically over both hips  Review of systems reveals that she has been a very healthy individual without chronic medical conditions  Physical exam Pleasant athletic appearing female in no acute distress BP 118/84   Ht 5\' 8"  (1.727 m)   Wt 155 lb (70.3 kg)   BMI 23.57 kg/m   Shoulder: Inspection reveals no abnormalities, atrophy or asymmetry. Palpation shows tenderness over AC joint / + cross over test NT bicipital groove. ROM is full in all planes. Rotator cuff strength normal throughout. No signs of impingement with negative Neer and Hawkin's tests, empty can. Speeds and Yergason's tests normal. No labral pathology noted with negative Obrien's, negative clunk and good stability. Normal scapular function observed. No painful arc and no drop arm sign. No apprehension sign [place] Joint is subluxed forward  Knee: Bilateral Normal to inspection with no erythema or effusion or obvious bony abnormalities. Palpation normal with no warmth or joint line tenderness or patellar tenderness or condyle  tenderness. ROM normal in flexion and extension and lower leg rotation. Ligaments with solid consistent endpoints including ACL, PCL, LCL, MCL. Negative Mcmurray's and provocative meniscal tests. Non painful patellar compression. Patellar and quadriceps tendons unremarkable. Hamstring and quadriceps strength is normal.  Hypermobility screen + Thumbs to wrist/ Both elbows/ palms to floor/ Hips and shoulders Mild skin laxity and smooth texture

## 2023-10-01 ENCOUNTER — Encounter: Payer: Self-pay | Admitting: Cardiology

## 2023-10-01 ENCOUNTER — Ambulatory Visit (INDEPENDENT_AMBULATORY_CARE_PROVIDER_SITE_OTHER)

## 2023-10-01 ENCOUNTER — Ambulatory Visit: Attending: Cardiology | Admitting: Cardiology

## 2023-10-01 ENCOUNTER — Telehealth: Payer: Self-pay | Admitting: Cardiology

## 2023-10-01 VITALS — BP 115/79 | HR 74

## 2023-10-01 DIAGNOSIS — R002 Palpitations: Secondary | ICD-10-CM | POA: Diagnosis not present

## 2023-10-01 DIAGNOSIS — R0602 Shortness of breath: Secondary | ICD-10-CM | POA: Diagnosis not present

## 2023-10-01 NOTE — Telephone Encounter (Signed)
 Spoke with patient and she states she has been having fluttering since Saturday.  Yesterday the fluttering was causing her to feel anxious with SOB. She states she can feel the skipping of her heart. HR last night was at 57. Currently 105. Denies any chest pain, swelling, nausea or vomiting. Appointment scheduled with DOD

## 2023-10-01 NOTE — Progress Notes (Unsigned)
 Enrolled patient for a 7 day Zio XT monitor to be mailed to patients home.

## 2023-10-01 NOTE — Telephone Encounter (Signed)
 Pt c/o Shortness Of Breath: STAT if SOB developed within the last 24 hours or pt is noticeably SOB on the phone  1. Are you currently SOB (can you hear that pt is SOB on the phone)? No  2. How long have you been experiencing SOB? Saturday  3. Are you SOB when sitting or when up moving around? Both  4. Are you currently experiencing any other symptoms? Heart flutters that come and go, no energy

## 2023-10-01 NOTE — Progress Notes (Signed)
  Cardiology Office Note:   Date:  10/01/2023  ID:  [name], DOB 04-Jun-1971, MRN [id number]  History of Present Illness:   Ashlee Graham is a 53 y.o. female with family history of early CAD who previously followed with Dr. Shari Prows who now presents to clinic for follow-up.  Per review of the record, patient with history of chest pain. GXT in 2018 normal. Ca score 0 on 01/2020.  Since last clinic visit, she reports that over the weekend she started having palpitations described as fluttering in chest.  Also feels short of breath during episodes.  States that she had previously had similar symptoms when drinking caffeine which she now avoids.  However over the weekend she had chocolate cake and since that time has been feeling the fluttering in her chest.  States that she listen to her heart and it was irregular.  Also reports some lightheadedness, denies any syncope.   Past Medical History:  Diagnosis Date   History of endometriosis    Pelvic pain in female    Rotator cuff syndrome 03/30/2012   This is now chronic for 7 months involving the left shoulder. I suspect at the onset of the injury she probably had a partial tear of her supraspinatous and infraspinatus that appear to have essentially healed but left her with some chronic tendinopathy.    Shoulder pain, left 03/30/2012   This is chronic now for 7 months but does respond to ibuprofen      ROS: As per HPI  Studies Reviewed:    EKG:   10/01/23: Normal sinus rhythm, rate 74, no ST abnormalities   Risk Assessment/Calculations:              Physical Exam:   VS:  BP 115/79   Pulse 74   SpO2 99%    Wt Readings from Last 3 Encounters:  01/12/23 155 lb (70.3 kg)  11/12/22 155 lb (70.3 kg)  01/09/20 140 lb 3.2 oz (63.6 kg)     GEN: Well nourished, well developed in no acute distress NECK: No JVD; No carotid bruits CARDIAC: RRR, no murmurs, rubs, gallops RESPIRATORY:  Clear to auscultation without rales, wheezing or  rhonchi  ABDOMEN: Soft, non-tender, non-distended EXTREMITIES:  No edema; No deformity   ASSESSMENT AND PLAN:    #Palpitations/Dyspnea -Description concerning for arrhythmia, evaluate with Zio patch x 7 days -Echocardiogram to rule out structural heart  #Chronic Cough: #Sputum Production: -Patient with chronic cough with sputum production as well as episodes of SOB/chest tightness at rest -No known history of asthma but had a lot of second hand smoke exposure as a child -Symptoms do not sound cardiac in nature -Requesting referral to Pulm for further evaluation, but she has declined as cough has improved  #Family History of Premature CAD: #History of atypical chest pain -Ca score in 2021 was 0 -GXT in 2018 without ischemia -Continue aggressive lifestyle modifications       RTC in 6 months  Signed, Little Ishikawa, MD

## 2023-10-01 NOTE — Patient Instructions (Addendum)
 Medication Instructions:  No Changes  Lab Work: None  Testing/Procedures: Your physician has requested that you have an echocardiogram. Echocardiography is a painless test that uses sound waves to create images of your heart. It provides your doctor with information about the size and shape of your heart and how well your heart's chambers and valves are working. This procedure takes approximately one hour. There are no restrictions for this procedure. Please do NOT wear cologne, perfume, aftershave, or lotions (deodorant is allowed). Please arrive 15 minutes prior to your appointment time. This will take place at [address]    ZIO XT- Long Term Monitor Instructions  Your physician has requested you wear a ZIO patch monitor for 7 days.  This is a single patch monitor. [employer] supplies one patch monitor per enrollment. Additional stickers are not available. Please do not apply patch if you will be having a Nuclear Stress Test,  Echocardiogram, Cardiac CT, MRI, or Chest Xray during the period you would be wearing the  monitor. The patch cannot be worn during these tests. You cannot remove and re-apply the  ZIO XT patch monitor.  Your ZIO patch monitor will be mailed 3 day USPS to your address on file. It may take 3-5 days  to receive your monitor after you have been enrolled.  Once you have received your monitor, please review the enclosed instructions. Your monitor  has already been registered assigning a specific monitor serial # to you.  Billing and Patient Assistance Program Information  We have supplied [employer] with any of your insurance information on file for billing purposes. [employer] offers a sliding scale Patient Assistance Program for patients that do not have  insurance, or whose insurance does not completely cover the cost of the ZIO monitor.  You must apply for the Patient Assistance Program to qualify for this discounted rate.  To apply, please call [employer]  at [phone], select option 4, select option 2, ask to apply for  Patient Assistance Program. Meredeth Ide will ask your household income, and how many people  are in your household. They will quote your out-of-pocket cost based on that information.  [employer] will also be able to set up a 84-month, interest-free payment plan if needed.  Applying the monitor   Shave hair from upper left chest.  Hold abrader disc by orange tab. Rub abrader in 40 strokes over the upper left chest as  indicated in your monitor instructions.  Clean area with 4 enclosed alcohol pads. Let dry.  Apply patch as indicated in monitor instructions. Patch will be placed under collarbone on left  side of chest with arrow pointing upward.  Rub patch adhesive wings for 2 minutes. Remove white label marked "1". Remove the white  label marked "2". Rub patch adhesive wings for 2 additional minutes.  While looking in a mirror, press and release button in center of patch. A small green light will  flash 3-4 times. This will be your only indicator that the monitor has been turned on.  Do not shower for the first 24 hours. You may shower after the first 24 hours.  Press the button if you feel a symptom. You will hear a small click. Record Date, Time and  Symptom in the Patient Logbook.  When you are ready to remove the patch, follow instructions on the last 2 pages of Patient  Logbook. Stick patch monitor onto the last page of Patient Logbook.  Place Patient Logbook in the blue and  white box. Use locking tab on box and tape box closed  securely. The blue and white box has prepaid postage on it. Please place it in the mailbox as  soon as possible. Your physician should have your test results approximately 7 days after the  monitor has been mailed back to [facility].  Call [facility] Customer Care at [phone] if you have questions regarding  your ZIO XT patch monitor. Call them immediately if you see an orange light  blinking on your  monitor.  If your monitor falls off in less than 4 days, contact our Monitor department at [phone].  If your monitor becomes loose or falls off after 4 days call [employer] at [phone] for  suggestions on securing your monitor   Follow-Up: At [facility], you and your health needs are our priority.  As part of our continuing mission to provide you with exceptional heart care, we have created designated Provider Care Teams.  These Care Teams include your primary Cardiologist (physician) and Advanced Practice Providers (APPs -  Physician Assistants and Nurse Practitioners) who all work together to provide you with the care you need, when you need it.  We recommend signing up for the patient portal called "MyChart".  Sign up information is provided on this After Visit Summary.  MyChart is used to connect with patients for Virtual Visits (Telemedicine).  Patients are able to view lab/test results, encounter notes, upcoming appointments, etc.  Non-urgent messages can be sent to your provider as well.   To learn more about what you can do with MyChart, go to [email or web address].    Your next appointment:   6 month(s)  Provider:   Little Ishikawa, MD

## 2023-10-14 ENCOUNTER — Telehealth: Payer: Self-pay | Admitting: Cardiology

## 2023-10-14 MED ORDER — METOPROLOL TARTRATE 25 MG PO TABS: 25.0000 mg | ORAL_TABLET | Freq: Two times a day (BID) | ORAL | 1 refills | Status: DC | PRN

## 2023-10-14 NOTE — Telephone Encounter (Signed)
 Pt reports that she has been "out of rhythm" for a long time. She sent her Zio monitor in today= 6 days. She could not remember if she was supposed to wear it for 5 or 7.   Fluttering Started around 3/15th  She reports that she does get short of breath when it happens. She is having more frequent bouts of fluttering. She is not dizzy or faint. She reports that probably about out of every hour she is out of rhythm. Any little bit of stress can cause it to get worse. She can just be sitting in a chair and it happens. Right now her heart rate 80-110.

## 2023-10-14 NOTE — Telephone Encounter (Signed)
 Attempted to call patient, no answer left message requesting a call back.

## 2023-10-14 NOTE — Telephone Encounter (Signed)
 Patient was returning call. Please advise ?

## 2023-10-14 NOTE — Telephone Encounter (Signed)
 Patient c/o Palpitations:  STAT if patient reporting lightheadedness, shortness of breath, or chest pain  How long have you had palpitations/irregular HR/ Afib? Are you having the symptoms now? Fluttering for about 3-4 weeks. She states "it feels like anxiety"   Are you currently experiencing lightheadedness, SOB or CP? Nothing currently   Do you have a history of afib (atrial fibrillation) or irregular heart rhythm?   Have you checked your BP or HR? (document readings if available): 108-115 while on the phone   Are you experiencing any other symptoms?  SOB sometimes, nothing currently.   Pt called in stating her heart has been fluttering for about 3-4 weeks. She states she plans on sending her heart monitor back today. She states she felt like her EKG was not long enough and believes it wasn't accurate. She states it feels like it is getting more intense. Please advise. She asked if someone can call her back around 1pm.

## 2023-10-14 NOTE — Telephone Encounter (Signed)
 Spoke with Dr Tresa Endo- DOD- gave patient information and history. Orders Received: Metooprolol Tartrate 25mg  up to 2 times daily for irregular heart beat.  Called the patient back and gave the information above. Appointment scheduled with APP for one week follow up. Prescription sent to preferred pharmacy.   Instructed patient to be mindful of going from lying, to sitting, to standing- do these slowly due to starting a new medication. ER precautions given. She verbalized understanding.

## 2023-10-14 NOTE — Telephone Encounter (Signed)
 Pt called in requesting to switch providers. She mentioned 3 providers, would any one of you be willing to take over her care.

## 2023-10-20 ENCOUNTER — Telehealth: Payer: Self-pay

## 2023-10-20 NOTE — Telephone Encounter (Signed)
 End of summary report has been scanned into the chart. Please review

## 2023-10-21 ENCOUNTER — Telehealth: Payer: Self-pay | Admitting: Emergency Medicine

## 2023-10-21 DIAGNOSIS — R002 Palpitations: Secondary | ICD-10-CM | POA: Diagnosis not present

## 2023-10-21 DIAGNOSIS — I495 Sick sinus syndrome: Secondary | ICD-10-CM

## 2023-10-21 NOTE — Telephone Encounter (Signed)
 Zio monitor showed 6 pauses during day, longest lasting over seconds.  Her symptoms appear to correspond to PVCs, but overall PVC burden is low.  She called the office and DOD prescribed metoprolol as needed.  She was not on metoprolol at time of monitor.  Given her pauses, recommend stopping as needed metoprolol as this could worsen her pauses, and recommend referral to EP

## 2023-10-21 NOTE — Telephone Encounter (Signed)
 Ashlee Graham, I think Eulogio Ditch is out today, could you help me out with something? We got an alert on this patient yesterday about a critical result on her monitor. She is having pauses. She had called DOD last week and was started on as needed metoprolol. Can we discontinue her metoprolol and get her in with EP asap? I tried calling patient but no answer    Urgent referral placed to EP  Left a message on patient's Voicemail with the message above. Left call back number    Also tried the pt's spouse's number- left message with call back number.    Sent a MyChart message as well

## 2023-10-21 NOTE — Telephone Encounter (Signed)
 Metoprolol Tartrate d/c'd and taken off of pt's list.   Appt with APP for 10/22/23 cancelled. Dr Bjorn Pippin spoke with the patient over the phone. She will no longer be taking Metoprolol. After hearing results, she wanted to just follow up with EP and have APP appt cancelled.

## 2023-10-21 NOTE — Telephone Encounter (Signed)
 Spoke with patient.  Monitor showed sinus pauses up to 5 seconds, occurred while awake.  Will refer to EP.  After she turned in her monitor she was started on as needed metoprolol by DOD, will discontinue given pauses.

## 2023-10-21 NOTE — Addendum Note (Signed)
 Addended by: Scheryl Marten on: 10/21/2023 05:22 PM   Modules accepted: Orders

## 2023-10-22 ENCOUNTER — Ambulatory Visit: Admitting: Nurse Practitioner

## 2023-10-22 ENCOUNTER — Telehealth: Payer: Self-pay | Admitting: *Deleted

## 2023-10-22 DIAGNOSIS — R002 Palpitations: Secondary | ICD-10-CM

## 2023-10-22 NOTE — Telephone Encounter (Signed)
 Called and spoke to patient. Patient made aware to stop metoprolol and referral made to EP per Dr. Bjorn Pippin recommendations. Made patient aware to call office for any questions. Understanding verbalized.

## 2023-10-22 NOTE — Telephone Encounter (Signed)
 Called and made patient aware per Dr. Bjorn Pippin to stop metoprolol and referral for EP has been placed. Patient verbalized understanding

## 2023-10-26 ENCOUNTER — Ambulatory Visit (HOSPITAL_COMMUNITY): Attending: Cardiology

## 2023-10-26 DIAGNOSIS — R0602 Shortness of breath: Secondary | ICD-10-CM | POA: Diagnosis present

## 2023-10-26 DIAGNOSIS — R002 Palpitations: Secondary | ICD-10-CM | POA: Diagnosis present

## 2023-10-26 LAB — ECHOCARDIOGRAM COMPLETE
Area-P 1/2: 2.99 cm2
S' Lateral: 2.7 cm

## 2023-10-28 ENCOUNTER — Encounter: Payer: Self-pay | Admitting: *Deleted

## 2023-11-01 ENCOUNTER — Ambulatory Visit: Attending: Cardiovascular Disease | Admitting: Cardiovascular Disease

## 2023-11-01 ENCOUNTER — Encounter: Payer: Self-pay | Admitting: Cardiovascular Disease

## 2023-11-01 VITALS — BP 120/84 | HR 81 | Ht 68.0 in | Wt 156.0 lb

## 2023-11-01 DIAGNOSIS — I495 Sick sinus syndrome: Secondary | ICD-10-CM | POA: Diagnosis not present

## 2023-11-01 DIAGNOSIS — R002 Palpitations: Secondary | ICD-10-CM

## 2023-11-01 MED ORDER — METOPROLOL TARTRATE 25 MG PO TABS: 25.0000 mg | ORAL_TABLET | Freq: Two times a day (BID) | ORAL | 3 refills | Status: DC

## 2023-11-01 NOTE — Patient Instructions (Addendum)
 Medication Instructions:  START Metoprolol  Tartrate 25 mg twice daily *If you need a refill on your cardiac medications before your next appointment, please call your pharmacy*  Follow-Up: At [facility], you and your health needs are our priority.  As part of our continuing mission to provide you with exceptional heart care, our providers are all part of one team.  This team includes your primary Cardiologist (physician) and Advanced Practice Providers or APPs (Physician Assistants and Nurse Practitioners) who all work together to provide you with the care you need, when you need it.  Your next appointment:   6 month(s)  Provider:   Marlane Silver, MD       1st Floor: - Lobby - Registration  - Pharmacy  - Lab - Cafe  2nd Floor: - PV Lab - Diagnostic Testing (echo, CT, nuclear med)  3rd Floor: - Vacant  4th Floor: - TCTS (cardiothoracic surgery) - AFib Clinic - Structural Heart Clinic - Vascular Surgery  - Vascular Ultrasound  5th Floor: - HeartCare Cardiology (general and EP) - Clinical Pharmacy for coumadin, hypertension, lipid, weight-loss medications, and med management appointments    Valet parking services will be available as well.

## 2023-11-01 NOTE — Progress Notes (Signed)
 Electrophysiology Office Note:    Date:  11/01/2023   ID:  Ashlee Graham, DOB 11-04-1970, MRN [id number]  PCP:  Thurman Flores, MD   [facility] Providers Cardiologist:  Wendie Hamburg, MD     Referring MD: Wendie Hamburg*   History of Present Illness:    Ashlee Graham is a 53 y.o. female with a family history of early coronary disease, referred for arrhythmia management.     Discussed the use of AI scribe software for clinical note transcription with the patient, who gave verbal consent to proceed.  History of Present Illness Ashlee Graham is a 53 year old female who presents with heart palpitations and pauses in heart rhythm.  Approximately three weeks ago, she began experiencing palpitations accompanied by shortness of breath, exacerbated by stress and disrupting her sleep. An initial consultation did not reveal any abnormalities, but further evaluation with an echocardiogram and a heart monitor was recommended. The heart monitor revealed pauses in her heart rhythm, with six episodes occurring between 8 and 9 AM, lasting up to 5.3 seconds. She recalled today that she was experimenting with vagal nerve stimulation, and at the time these events occurred, she had been submerging her face in ice water.  She noted that the palpitations became more aggressive towards the end of the monitoring period.  She was prescribed metoprolol , which she took for two days. The first dose provided immediate relief, but the palpitations returned the following day, leading her to discontinue the medication. She has also been taking magnesium supplements, specifically L-threonate and citrate, totaling 1200 mg nightly, which she believes helped with muscle relaxation during menopause.  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 However, stress and  returning to work have exacerbated her symptoms, causing a fluttery sensation in her chest and shortness of breath.  She has a family history of heart disease, with her father having died of a massive heart attack at 76. She is concerned about her heart health due to this family history.  She has been experiencing adrenal fatigue and is taking supplements like Adrenal and vitamin B to support her cortisol levels. She has also started hormone replacement therapy in January, which she feels has improved her overall well-being.         Today, she is experiencing her PVC symptoms/  EKGs/Labs/Other Studies Reviewed Today:     Echocardiogram:  TTE October 26, 2023 EF 55 to 60%.  Normal structure, function and size   Monitors:  5 day monitor March 2025-- my interpretation Sinus rhythm 49 to 161 bpm, average 74 beats minute Less than 1% supraventricular ectopy.  2.9% ventricular ectopy. Symptom episodes correlate with PVCs. There were 6 pauses of up to 6.2 seconds; all of these occurred at about 8 - 9 AM.   Advanced imaging:  Coronary calcium score CT July 2021 Calcium score of 0   EKG:   EKG Interpretation Date/Time:  Monday November 01 2023 12:03:39 EDT Ventricular Rate:  81 PR Interval:  142 QRS Duration:  84 QT Interval:  384 QTC Calculation: 446 R Axis:   79  Text Interpretation: Sinus rhythm with sinus arrhythmia with occasional Premature ventricular complexes When compared with ECG of 01-Oct-2023 10:47, Premature ventricular complexes are now Present Confirmed by Marlane Silver [phone]) on 11/01/2023 12:05:32 PM     Physical Exam:    VS:  There were no  vitals taken for this visit.    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  If this does not work, we will try propranolol Continue magnesium  Pauses Pauses up to 5.3 seconds occurred These correlated with vagal maneuvers (plunging her face in ice water) No intervention required  Family history of premature coronary disease No personal history of coronary disease Calcium score was 0 in 2021      Signed, Efraim Grange, MD  11/01/2023 12:07 PM    [facility]

## 2023-11-08 ENCOUNTER — Emergency Department (HOSPITAL_BASED_OUTPATIENT_CLINIC_OR_DEPARTMENT_OTHER)
Admission: EM | Admit: 2023-11-08 | Discharge: 2023-11-08 | Disposition: A | Discharge status: Home / Self Care | Payer: Self-pay | Attending: Emergency Medicine | Admitting: Emergency Medicine

## 2023-11-08 ENCOUNTER — Other Ambulatory Visit: Payer: Self-pay

## 2023-11-08 ENCOUNTER — Telehealth: Payer: Self-pay | Admitting: Cardiovascular Disease

## 2023-11-08 ENCOUNTER — Emergency Department (HOSPITAL_BASED_OUTPATIENT_CLINIC_OR_DEPARTMENT_OTHER): Payer: Self-pay

## 2023-11-08 ENCOUNTER — Encounter (HOSPITAL_BASED_OUTPATIENT_CLINIC_OR_DEPARTMENT_OTHER): Payer: Self-pay | Admitting: *Deleted

## 2023-11-08 DIAGNOSIS — R079 Chest pain, unspecified: Secondary | ICD-10-CM | POA: Insufficient documentation

## 2023-11-08 LAB — COMPREHENSIVE METABOLIC PANEL WITH GFR
ALT: 13 U/L (ref 0–44)
AST: 19 U/L (ref 15–41)
Albumin: 4.3 g/dL (ref 3.5–5.0)
Alkaline Phosphatase: 57 U/L (ref 38–126)
Anion gap: 10 (ref 5–15)
BUN: 13 mg/dL (ref 6–20)
CO2: 24 mmol/L (ref 22–32)
Calcium: 9.4 mg/dL (ref 8.9–10.3)
Chloride: 104 mmol/L (ref 98–111)
Creatinine, Ser: 0.99 mg/dL (ref 0.44–1.00)
GFR, Estimated: >60 mL/min (ref >60)
Glucose, Bld: 101 mg/dL — ABNORMAL HIGH (ref 70–99)
Potassium: 3.7 mmol/L (ref 3.5–5.1)
Sodium: 138 mmol/L (ref 135–145)
Total Bilirubin: 0.3 mg/dL (ref 0.0–1.2)
Total Protein: 6.3 g/dL — ABNORMAL LOW (ref 6.5–8.1)

## 2023-11-08 LAB — CBC
HCT: 41.7 % (ref 36.0–46.0)
Hemoglobin: 14.3 g/dL (ref 12.0–15.0)
MCH: 30.2 pg (ref 26.0–34.0)
MCHC: 34.3 g/dL (ref 30.0–36.0)
MCV: 88 fL (ref 80.0–100.0)
Platelets: 272 10*3/uL (ref 150–400)
RBC: 4.74 MIL/uL (ref 3.87–5.11)
RDW: 13.4 % (ref 11.5–15.5)
WBC: 6.8 10*3/uL (ref 4.0–10.5)
nRBC: 0 % (ref 0.0–0.2)

## 2023-11-08 LAB — TROPONIN T, HIGH SENSITIVITY
Troponin T High Sensitivity: <15 ng/L (ref <19)
Troponin T High Sensitivity: <15 ng/L (ref <19)

## 2023-11-08 LAB — LIPASE, BLOOD: Lipase: 27 U/L (ref 11–51)

## 2023-11-08 MED ORDER — PANTOPRAZOLE SODIUM 40 MG PO TBEC: 40.0000 mg | DELAYED_RELEASE_TABLET | Freq: Every day | ORAL | Status: DC

## 2023-11-08 MED ORDER — ALUM & MAG HYDROXIDE-SIMETH 200-200-20 MG/5ML PO SUSP
30.0000 mL | Freq: Once | ORAL | Status: AC
  Administered 2023-11-08: 30 mL via ORAL
  Filled 2023-11-08: qty 30

## 2023-11-08 MED ORDER — PANTOPRAZOLE SODIUM 40 MG PO TBEC
40.0000 mg | DELAYED_RELEASE_TABLET | Freq: Every day | ORAL | Status: DC
  Administered 2023-11-08: 40 mg via ORAL
  Filled 2023-11-08: qty 1

## 2023-11-08 MED ORDER — PANTOPRAZOLE SODIUM 40 MG PO TBEC: 40.0000 mg | DELAYED_RELEASE_TABLET | Freq: Every day | ORAL | 0 refills | Status: DC

## 2023-11-08 NOTE — Telephone Encounter (Signed)
   Pt c/o of Chest Pain: STAT if active CP, including tightness, pressure, jaw pain, radiating pain to shoulder/upper arm/back, CP unrelieved by Nitro. Symptoms reported of SOB, nausea, vomiting, sweating.  1. Are you having CP right now? no    2. Are you experiencing any other symptoms (ex. SOB, nausea, vomiting, sweating)? no   3. Is your CP continuous or coming and going? both   4. Have you taken Nitroglycerin ? no   5. How long have you been experiencing CP? Since m 3:45 am    6. If NO CP at time of call then end call with telling Pt to call back or call 911 if Chest pain returns prior to return call from triage team.  Pt is at [facility]  ER right now. Wanted us  to know.

## 2023-11-08 NOTE — Telephone Encounter (Signed)
 Called patient. Patient complaining of chest pain this morning. Patient stated she went to the ED at [facility] and she was discharged and told to follow-up with cardiology. Made patient an appointment tomorrow to see Dayna Dunn PA to follow-up and discuss further testing.

## 2023-11-08 NOTE — Telephone Encounter (Signed)
 Agree with plan

## 2023-11-08 NOTE — Progress Notes (Unsigned)
 Cardiology Office Note    Date:  11/09/2023  ID:  Chundra, Lohn 1971-04-17, MRN [id number] PCP:  Thurman Flores, MD  Cardiologist:  Wendie Hamburg, MD  Electrophysiologist:  Efraim Grange, MD   Chief Complaint: ER follow-up for chest pain  History of Present Illness: .    Ashlee Graham is a 53 y.o. female with visit-pertinent history of PVCs, endometriosis, chest pain, family history of early CAD seen for ER follow-up. 2018 calcium score and ETT were normal. F/u calcium score 2021 was zero, no other incidental findings. Recent monitor for palpitations 10/2023 showed 2.9% PVCs (corresponded with patient agents), 6 pauses longest lasting 5.3 beats. Echo showed EF 55-60%, aortic sclerosis without stenosis. She saw Dr. Arlester Ladd who identified that her pauses corresponded with vagal maneuvers -  plunging her face in ice water - and did not require further intervention. She had been trialing this maneuver at the advice of a friend who mentioned that it may help with arrhythmias. She was given permission to resume trial of metoprolol  (recently prescribed while awaiting event monitor). She has been taking supplements at the direction of [facility] for menopause relief and adrenal fatigue as well as itraconazole treatment for mold exposure.   She was seen in the ED 11/08/23 for chest pain. Sunday into Monday night she rolled over in bed and began feeling sensation of significant substernal chest discomfort. She thought it might be indigestion but Tums did not relieve it so she went to the ER. The pain lasted from 3:15-4:30 with some additional waxing/waning features about an hour and a half thereafter. There were no provoking or relieving factors. It spontaneously subsided on its own. She also has been having some intermittent GI symptoms with increased frequency of BM as well as some brackish feeling coming back in her throat. ED workup showed negative troponins. CXR,  RUQ US , EKG unrevealing. She was prescribed PPI. She recalls that her father had a similar constellation of issues for months before his heart attack.  She has been hesitant to use metoprolol , taking sparingly; she prefers to minimize the amount of medicine she takes unless absolutely needed. She did take this last night and this morning, however, and has noticed the skips have subsided. No further chest pain.   Labwork independently reviewed: 11/08/23 troponins neg x2, normal lipase, K 3.7, Cr 0.99, albumin 4.3, AST ALT OK, CBC OK  ROS: .    Please see the history of present illness.  All other systems are reviewed and otherwise negative.  Studies Reviewed: Aaron Aas    EKG:  EKG is ordered today, personally reviewed, demonstrating:  EKG Interpretation Date/Time:  Tuesday November 09 2023 11:13:41 EDT Ventricular Rate:  67 PR Interval:  154 QRS Duration:  76 QT Interval:  388 QTC Calculation: 409 R Axis:   59  Text Interpretation: Normal sinus rhythm Nonspecific ST upsloping inferiorly and V4-V6 similar to prior Confirmed by Sofija Antwi [phone]) on 11/09/2023 11:26:55 AM   Repeat obtained today given suspected lead placement issue I, avL in ED tracing.  CV Studies: Cardiac studies reviewed are outlined and summarized above. Otherwise please see EMR for full report.   Current Reported Medications:.    Current Meds  Medication Sig   ALPRAZolam (XANAX) 0.5 MG tablet TAKE 1 TABLET EVERY 8 HOURS AS NEEDED FOR ANXIETY   diphenhydrAMINE (BENADRYL ALLERGY) 25 mg capsule Take 25 mg by mouth as needed.   estradiol (VIVELLE-DOT) 0.0375 MG/24HR Place 1 patch onto  the skin 2 (two) times a week.   ibuprofen (ADVIL,MOTRIN) 200 MG tablet Take 200 mg by mouth every 6 (six) hours as needed.   metoprolol  tartrate (LOPRESSOR ) 25 MG tablet Take 1 tablet (25 mg total) by mouth 2 (two) times daily.   progesterone (PROMETRIUM) 200 MG capsule Take 200 mg by mouth daily.    Physical Exam:    VS:  BP 104/70    Pulse 67   Ht 5\' 8"  (1.727 m)   Wt 156 lb 9.6 oz (71 kg)   SpO2 99%   BMI 23.81 kg/m    Wt Readings from Last 3 Encounters:  11/09/23 156 lb 9.6 oz (71 kg)  11/08/23 155 lb (70.3 kg)  11/01/23 156 lb (70.8 kg)    GEN: Well nourished, well developed in no acute distress NECK: No JVD; No carotid bruits CARDIAC: RRR, no murmurs, rubs, gallops RESPIRATORY:  Clear to auscultation without rales, wheezing or rhonchi  ABDOMEN: Soft, non-tender, non-distended EXTREMITIES:  No edema; No acute deformity   Asessement and Plan:.    1. Atypical chest pain with family history of CAD - mixed features with concern given family history of coronary disease in her father. EKG, troponins reassuring. Recent echo with normal LVEF. We will proceed with coronary CTA for evaluation as well as future risk stratification. I reached out to CT RN navigator team to discuss pre-CT beta blocker dose since she just restarted metoprolol  and HR 67, BP 104/70 this AM. Per Abraham Hoffmann, we will have her take her regular dose as scheduled without additional pre-medication, but making sure to take her AM dose that day at least 2 hours before the scan. I told the patient if we are unable to get the study due to frequent PVCs, I would switch to an exercise nuclear stress test. I also encouraged trial of PPI just as empiric rx as this will also help provide feedback on potential etiology if this was non-cardiac discomfort. K was slightly below goal at ED visit. Recheck BMET with Mg, TSH, free T4 today. She reports lipids are followed by OBGYN who serves as her PCP.  2. PVCs - follow with intended regimen of metoprolol . I told her there are many ways to treat these and our objective is symptom management. She will continue 25mg  BID as outlined, but I did give her permission to decrease to 12.5mg  BID if she finds she does not like the way she feels on the 25mg  BID dose. Dr. Arlester Ladd had wanted to see her back in 6 months per last note, keep EP plan  as outlined.    Disposition: F/u with me in 8 weeks.  Signed, Rhemi Balbach N Kano Heckmann, PA-C

## 2023-11-08 NOTE — Discharge Instructions (Addendum)
 You were evaluated in the Emergency Department and after careful evaluation, we did not find any emergent condition requiring admission or further testing in the hospital.  Your exam/testing today is overall reassuring.  Suspect symptoms are related to acid reflux or inflammation of the stomach.  You may take protonix (acid blocker medication) as prescribed. Try maalox or mylanta as need. Still would recommend follow-up with cardiology so they can consider further testing.  Please return to the Emergency Department if you experience any worsening of your condition.   Thank you for allowing us  to be a part of your care.

## 2023-11-08 NOTE — ED Notes (Signed)
 ED Provider at bedside.

## 2023-11-08 NOTE — ED Triage Notes (Addendum)
 C/o anterior chest pain that woke her up this morning that radiates into her upper back. Describes as burning. Has had "stomach upset" over the past several days- diarrhea and nausea. Took tums, has gotten some relief.   States she has had some "irregular" heart beats where she has worn a monitor- states she was diagnosed with PVCs and started on lopressor .  Denies any sob.

## 2023-11-08 NOTE — ED Provider Notes (Signed)
 [facility] Emergency Department Provider Note MRN:  [id number]  Arrival date & time: 11/08/23     Chief Complaint   Chest Pain   History of Present Illness   Ashlee Graham is a 53 y.o. year-old female with no pertinent past medical history presenting to the ED with chief complaint of chest pain.  Sharp pain in the center of the chest waking her from sleep about 20 minutes prior to arrival.  Occasional radiation of pain to the mid thoracic back.  Pain seems to be waxing and waning, was more prominent at home, currently only 2 out of 10 in severity.  Denies dizziness or diaphoresis, no nausea or vomiting, no trouble breathing.  No recent leg pain or swelling, no personal history of blood clots, no personal history of heart attacks.  Review of Systems  A thorough review of systems was obtained and all systems are negative except as noted in the HPI and PMH.   Patient's Health History    Past Medical History:  Diagnosis Date   History of endometriosis    Pelvic pain in female    Rotator cuff syndrome 03/30/2012   This is now chronic for 7 months involving the left shoulder. I suspect at the onset of the injury she probably had a partial tear of her supraspinatous and infraspinatus that appear to have essentially healed but left her with some chronic tendinopathy.    Shoulder pain, left 03/30/2012   This is chronic now for 7 months but does respond to ibuprofen     Past Surgical History:  Procedure Laterality Date   CESAREAN SECTION  03-03-2006   LAPAROSCOPY N/A 08/29/2015   Procedure: LAPAROSCOPY DIAGNOSTIC , drainage of left ovarian cyst;  Surgeon: Thurman Flores, MD;  Location: [facility]  Service: Gynecology;  Laterality: N/A;   LAPAROSCOPY W/ FULGERATION ENDOMETRIOSIS  10-02-2004    Family History  Problem Relation Age of Onset   Heart attack Father 66       deceased    Social History   Socioeconomic History   Marital status:  Married    Spouse name: Not on file   Number of children: Not on file   Years of education: Not on file   Highest education level: Not on file  Occupational History   Not on file  Tobacco Use   Smoking status: Never   Smokeless tobacco: Never  Substance and Sexual Activity   Alcohol use: Never   Drug use: Never   Sexual activity: Yes    Partners: Male    Comment: married  Other Topics Concern   Not on file  Social History Narrative   Not on file   Social Drivers of Health   Financial Resource Strain: Not on file  Food Insecurity: Not on file  Transportation Needs: Not on file  Physical Activity: Not on file  Stress: Not on file  Social Connections: Not on file  Intimate Partner Violence: Not on file     Physical Exam   Vitals:   11/08/23 0500 11/08/23 0600  BP: 130/86 105/78  Pulse: 64 (!) 56  Resp: 14 16  Temp:    SpO2: 99% 96%    CONSTITUTIONAL: Well-appearing, NAD NEURO/PSYCH:  Alert and oriented x 3, no focal deficits EYES:  eyes equal and reactive ENT/NECK:  no LAD, no JVD CARDIO: Regular rate, well-perfused, normal S1 and S2 PULM:  CTAB no wheezing or rhonchi GI/GU:  non-distended, non-tender MSK/SPINE:  No gross  deformities, no edema SKIN:  no rash, atraumatic   *Additional and/or pertinent findings included in MDM below  Diagnostic and Interventional Summary    EKG Interpretation Date/Time:  November 08, 2023 at 04:55:39 Ventricular Rate:   63 PR Interval:   164 QRS Duration:   92 QT Interval:   403 QTC Calculation:  413 R Axis:      Text Interpretation: Sinus rhythm, occasional PVC       Labs Reviewed  COMPREHENSIVE METABOLIC PANEL WITH GFR - Abnormal; Notable for the following components:      Result Value   Glucose, Bld 101 (*)    Total Protein 6.3 (*)    All other components within normal limits  CBC  LIPASE, BLOOD  PREGNANCY, URINE  TROPONIN T, HIGH SENSITIVITY  TROPONIN T, HIGH SENSITIVITY    DG Chest Port 1 View  Final  Result    US  Abdomen Limited RUQ (LIVER/GB)    (Results Pending)    Medications  pantoprazole (PROTONIX) EC tablet 40 mg (40 mg Oral Given 11/08/23 0613)  alum & mag hydroxide-simeth (MAALOX/MYLANTA) 200-200-20 MG/5ML suspension 30 mL (30 mLs Oral Given 11/08/23 1324)     Procedures  /  Critical Care Procedures  ED Course and Medical Decision Making  Initial Impression and Ddx Patient has had some recent diarrhea congestion over the past few days and now being woken up by this sharp chest pain, some radiation to the back.  Waxing and waning.  Pain seems to be in the upper chest, lower abdomen near the xiphoid process.  Differential diagnosis includes gastritis, ACS with atypical presentation, cholecystitis, biliary colic, GERD.  Past medical/surgical history that increases complexity of ED encounter: None  Interpretation of Diagnostics I personally reviewed the EKG and my interpretation is as follows: Sinus rhythm without concerning ischemic findings, occasional PVC  No significant blood count or electrolyte disturbance.  First troponin negative  Patient Reassessment and Ultimate Disposition/Management     Will need second troponin.  Highly doubt PE, highly doubt dissection.  Patient feeling better.  Favoring GI cause, would be candidate for discharge if second troponin also negative.  Signed out to oncoming provider at shift change.  Patient management required discussion with the following services or consulting groups:  None  Complexity of Problems Addressed Acute illness or injury that poses threat of life of bodily function  Additional Data Reviewed and Analyzed Further history obtained from: Further history from spouse/family member  Additional Factors Impacting ED Encounter Risk Consideration of hospitalization  Merrick Abe. Harless Lien, MD [facility] Emergency Medicine [facility] [email or web address]  Final Clinical Impressions(s) / ED Diagnoses      ICD-10-CM   1. Chest pain, unspecified type  R07.9 Ambulatory referral to Cardiology      ED Discharge Orders          Ordered    Ambulatory referral to Cardiology        11/08/23 0702             Discharge Instructions Discussed with and Provided to Patient:     Discharge Instructions      You were evaluated in the Emergency Department and after careful evaluation, we did not find any emergent condition requiring admission or further testing in the hospital.  Your exam/testing today is overall reassuring.  Suspect symptoms are related to acid reflux or inflammation of the stomach.  Still would recommend follow-up with cardiology so they can consider further testing.  Please return to the  Emergency Department if you experience any worsening of your condition.   Thank you for allowing us  to be a part of your care.       Edson Graces, MD 11/08/23 [phone]

## 2023-11-08 NOTE — ED Notes (Signed)
 Discharge paperwork given and verbally understood.

## 2023-11-08 NOTE — ED Provider Notes (Signed)
 Patient signed out by Dr Harless Lien to d/c to home if/when trop and u/s resulted.   Trop is normal. U/s neg for acute process.   Vitals stable. No distress. Pt currently appears stable for ED d/c per Dr Valera Gaster plan.    Guadalupe Lee, MD 11/08/23 1020

## 2023-11-09 ENCOUNTER — Ambulatory Visit: Attending: Physician Assistant | Admitting: Physician Assistant

## 2023-11-09 ENCOUNTER — Encounter: Payer: Self-pay | Admitting: Physician Assistant

## 2023-11-09 VITALS — BP 104/70 | HR 67 | Ht 68.0 in | Wt 156.6 lb

## 2023-11-09 DIAGNOSIS — R0789 Other chest pain: Secondary | ICD-10-CM

## 2023-11-09 DIAGNOSIS — I493 Ventricular premature depolarization: Secondary | ICD-10-CM

## 2023-11-09 DIAGNOSIS — Z8249 Family history of ischemic heart disease and other diseases of the circulatory system: Secondary | ICD-10-CM

## 2023-11-09 NOTE — Patient Instructions (Signed)
 Medication Instructions:  Your physician recommends that you continue on your current medications as directed. Please refer to the Current Medication list given to you today.  *If you need a refill on your cardiac medications before your next appointment, please call your pharmacy*  Lab Work: BMET, Mag, TSH, T4, Free If you have labs (blood work) drawn today and your tests are completely normal, you will receive your results only by: MyChart Message (if you have MyChart) OR A paper copy in the mail If you have any lab test that is abnormal or we need to change your treatment, we will call you to review the results.  Testing/Procedures: Coronary CTA Your physician has requested that you have cardiac CT. Cardiac computed tomography (CT) is a painless test that uses an x-ray machine to take clear, detailed pictures of your heart. For further information please visit [email or web address] Please follow instruction sheet as given.  We will call you with CTA instructions   Follow-Up: At [facility], you and your health needs are our priority.  As part of our continuing mission to provide you with exceptional heart care, our providers are all part of one team.  This team includes your primary Cardiologist (physician) and Advanced Practice Providers or APPs (Physician Assistants and Nurse Practitioners) who all work together to provide you with the care you need, when you need it.  Your next appointment:   8 weeks  Provider:   Lisabeth Rider  We recommend signing up for the patient portal called "MyChart".  Sign up information is provided on this After Visit Summary.  MyChart is used to connect with patients for Virtual Visits (Telemedicine).  Patients are able to view lab/test results, encounter notes, upcoming appointments, etc.  Non-urgent messages can be sent to your provider as well.   To learn more about what you can do with MyChart, go to [email or web address].   Other  Instructions

## 2023-11-10 ENCOUNTER — Telehealth: Payer: Self-pay | Admitting: Cardiology

## 2023-11-10 ENCOUNTER — Telehealth: Payer: Self-pay

## 2023-11-10 DIAGNOSIS — Z8249 Family history of ischemic heart disease and other diseases of the circulatory system: Secondary | ICD-10-CM

## 2023-11-10 DIAGNOSIS — R0789 Other chest pain: Secondary | ICD-10-CM

## 2023-11-10 LAB — TSH: TSH: 2.43 u[IU]/mL (ref 0.450–4.500)

## 2023-11-10 LAB — BASIC METABOLIC PANEL WITH GFR
BUN/Creatinine Ratio: 14 (ref 9–23)
BUN: 13 mg/dL (ref 6–24)
CO2: 20 mmol/L (ref 20–29)
Calcium: 9.1 mg/dL (ref 8.7–10.2)
Chloride: 103 mmol/L (ref 96–106)
Creatinine, Ser: 0.92 mg/dL (ref 0.57–1.00)
Glucose: 81 mg/dL (ref 70–99)
Potassium: 4.5 mmol/L (ref 3.5–5.2)
Sodium: 137 mmol/L (ref 134–144)
eGFR: 74 mL/min/{1.73_m2} (ref >59)

## 2023-11-10 LAB — T4, FREE: Free T4: 1.2 ng/dL (ref 0.82–1.77)

## 2023-11-10 LAB — MAGNESIUM: Magnesium: 2.1 mg/dL (ref 1.6–2.3)

## 2023-11-10 NOTE — Telephone Encounter (Signed)
 Spoke with pt regarding the Coronary CTA that Dunn, PA-C suggested she have. Coronary CTA was ordered. Pt was called and told to take her metoprolol  as prescribed 2 hours prior to the test and to drink plenty of fluids to support blood pressure. Instructions were sent via MyChart. Pt aware. Pt verbalized understanding. All questions if any were answered.

## 2023-11-10 NOTE — Telephone Encounter (Signed)
 Patient wants a call back regarding CPT/Diagnostic code to provide to her insurance company to get Coronary CT test approved.

## 2023-11-10 NOTE — Telephone Encounter (Signed)
-----   Message from Dayna N Dunn sent at 11/09/2023 12:35 PM EDT ----- Regarding: Cor CT This is the patient we discussed in clinic that we were sorting out pre-CT beta blocker dose. She is on metoprolol  25mg  BID. Per discussion with our CT RN coordinator team, please have her continue present dose of metoprolol  25mg  BID leading into the days before the test - the AM of test should make sure to have that morning dose at least 2 hours before the study.   She should also drink plenty of water before the test (not go excess/overboard, just well hydrated) to support BP. Thank you!

## 2023-11-24 ENCOUNTER — Encounter (HOSPITAL_COMMUNITY): Payer: Self-pay

## 2023-11-26 ENCOUNTER — Ambulatory Visit (HOSPITAL_COMMUNITY)
Admission: RE | Admit: 2023-11-26 | Discharge: 2023-11-26 | Disposition: A | Discharge status: Home / Self Care | Payer: PRIVATE HEALTH INSURANCE | Source: Ambulatory Visit | Attending: Physician Assistant | Admitting: Physician Assistant

## 2023-11-26 DIAGNOSIS — Z8249 Family history of ischemic heart disease and other diseases of the circulatory system: Secondary | ICD-10-CM | POA: Insufficient documentation

## 2023-11-26 DIAGNOSIS — R0789 Other chest pain: Secondary | ICD-10-CM | POA: Insufficient documentation

## 2023-11-26 MED ORDER — IOHEXOL 350 MG/ML SOLN
100.0000 mL | Freq: Once | INTRAVENOUS | Status: AC | PRN
  Administered 2023-11-26: 100 mL via INTRAVENOUS

## 2023-11-26 MED ORDER — NITROGLYCERIN 0.4 MG SL SUBL
SUBLINGUAL_TABLET | SUBLINGUAL | Status: AC
  Filled 2023-11-26: qty 2

## 2023-11-26 MED ORDER — NITROGLYCERIN 0.4 MG SL SUBL
0.8000 mg | SUBLINGUAL_TABLET | Freq: Once | SUBLINGUAL | Status: AC
  Administered 2023-11-26: 0.8 mg via SUBLINGUAL

## 2023-11-29 ENCOUNTER — Ambulatory Visit: Payer: Self-pay | Admitting: Physician Assistant

## 2024-01-26 ENCOUNTER — Ambulatory Visit: Payer: PRIVATE HEALTH INSURANCE | Admitting: Physician Assistant

## 2024-02-05 ENCOUNTER — Encounter (HOSPITAL_COMMUNITY)
Admission: RE | Disposition: A | Discharge status: Home / Self Care | Payer: Self-pay | Source: Ambulatory Visit | Attending: Orthopedic Surgery

## 2024-02-05 ENCOUNTER — Encounter (HOSPITAL_COMMUNITY): Payer: Self-pay | Admitting: Orthopedic Surgery

## 2024-02-05 ENCOUNTER — Inpatient Hospital Stay (HOSPITAL_BASED_OUTPATIENT_CLINIC_OR_DEPARTMENT_OTHER): Payer: PRIVATE HEALTH INSURANCE

## 2024-02-05 ENCOUNTER — Other Ambulatory Visit: Payer: Self-pay

## 2024-02-05 ENCOUNTER — Ambulatory Visit (HOSPITAL_COMMUNITY)
Admission: RE | Admit: 2024-02-05 | Discharge: 2024-02-05 | Disposition: A | Discharge status: Home / Self Care | Payer: PRIVATE HEALTH INSURANCE | Source: Ambulatory Visit | Attending: Orthopedic Surgery | Admitting: Orthopedic Surgery

## 2024-02-05 ENCOUNTER — Inpatient Hospital Stay (HOSPITAL_COMMUNITY): Payer: PRIVATE HEALTH INSURANCE

## 2024-02-05 DIAGNOSIS — W228XXA Striking against or struck by other objects, initial encounter: Secondary | ICD-10-CM | POA: Insufficient documentation

## 2024-02-05 DIAGNOSIS — S61401A Unspecified open wound of right hand, initial encounter: Principal | ICD-10-CM

## 2024-02-05 DIAGNOSIS — S61011A Laceration without foreign body of right thumb without damage to nail, initial encounter: Secondary | ICD-10-CM | POA: Diagnosis present

## 2024-02-05 DIAGNOSIS — S66921A Laceration of unspecified muscle, fascia and tendon at wrist and hand level, right hand, initial encounter: Secondary | ICD-10-CM | POA: Insufficient documentation

## 2024-02-05 DIAGNOSIS — S66221A Laceration of extensor muscle, fascia and tendon of right thumb at wrist and hand level, initial encounter: Secondary | ICD-10-CM | POA: Insufficient documentation

## 2024-02-05 DIAGNOSIS — Y92009 Unspecified place in unspecified non-institutional (private) residence as the place of occurrence of the external cause: Secondary | ICD-10-CM | POA: Insufficient documentation

## 2024-02-05 HISTORY — PX: TENDON REPAIR: SHX5111

## 2024-02-05 HISTORY — DX: Cardiac arrhythmia, unspecified: I49.9

## 2024-02-05 HISTORY — PX: WOUND EXPLORATION: SHX6188

## 2024-02-05 LAB — CBC
HCT: 44.1 % (ref 36.0–46.0)
Hemoglobin: 14.5 g/dL (ref 12.0–15.0)
MCH: 29.9 pg (ref 26.0–34.0)
MCHC: 32.9 g/dL (ref 30.0–36.0)
MCV: 90.9 fL (ref 80.0–100.0)
Platelets: 277 K/uL (ref 150–400)
RBC: 4.85 MIL/uL (ref 3.87–5.11)
RDW: 12.3 % (ref 11.5–15.5)
WBC: 10 K/uL (ref 4.0–10.5)
nRBC: 0 % (ref 0.0–0.2)

## 2024-02-05 LAB — BASIC METABOLIC PANEL WITH GFR
Anion gap: 14 (ref 5–15)
BUN: 15 mg/dL (ref 6–20)
CO2: 20 mmol/L — ABNORMAL LOW (ref 22–32)
Calcium: 9.4 mg/dL (ref 8.9–10.3)
Chloride: 103 mmol/L (ref 98–111)
Creatinine, Ser: 1.01 mg/dL — ABNORMAL HIGH (ref 0.44–1.00)
GFR, Estimated: >60 mL/min (ref >60)
Glucose, Bld: 87 mg/dL (ref 70–99)
Potassium: 4.1 mmol/L (ref 3.5–5.1)
Sodium: 137 mmol/L (ref 135–145)

## 2024-02-05 SURGERY — WOUND EXPLORATION
Anesthesia: Monitor Anesthesia Care | Laterality: Right

## 2024-02-05 MED ORDER — LIDOCAINE HCL (PF) 1 % IJ SOLN
INTRAMUSCULAR | Status: AC
  Filled 2024-02-05: qty 10

## 2024-02-05 MED ORDER — LIDOCAINE 2% (20 MG/ML) 5 ML SYRINGE
INTRAMUSCULAR | Status: DC | PRN
  Administered 2024-02-05: 60 mg via INTRAVENOUS

## 2024-02-05 MED ORDER — BUPIVACAINE HCL (PF) 0.25 % IJ SOLN
INTRAMUSCULAR | Status: DC | PRN
  Administered 2024-02-05: 10 mL

## 2024-02-05 MED ORDER — LIDOCAINE HCL (PF) 1 % IJ SOLN
INTRAMUSCULAR | Status: DC | PRN
Start: 2024-02-05 — End: 2024-02-05
  Administered 2024-02-05: 10 mL

## 2024-02-05 MED ORDER — POVIDONE-IODINE 7.5 % EX SOLN
Freq: Once | CUTANEOUS | Status: DC
  Filled 2024-02-05 (×2): qty 118

## 2024-02-05 MED ORDER — PROPOFOL 500 MG/50ML IV EMUL
INTRAVENOUS | Status: DC | PRN
  Administered 2024-02-05: 90 ug/kg/min via INTRAVENOUS

## 2024-02-05 MED ORDER — 0.9 % SODIUM CHLORIDE (POUR BTL) OPTIME
TOPICAL | Status: DC | PRN
  Administered 2024-02-05: 1000 mL

## 2024-02-05 MED ORDER — FENTANYL CITRATE (PF) 250 MCG/5ML IJ SOLN
INTRAMUSCULAR | Status: DC | PRN
  Administered 2024-02-05 (×2): 50 ug via INTRAVENOUS

## 2024-02-05 MED ORDER — PROPOFOL 10 MG/ML IV BOLUS
INTRAVENOUS | Status: DC | PRN
  Administered 2024-02-05: 30 mg via INTRAVENOUS

## 2024-02-05 MED ORDER — LACTATED RINGERS IV SOLN: INTRAVENOUS | Status: DC

## 2024-02-05 MED ORDER — ACETAMINOPHEN 10 MG/ML IV SOLN: 1000.0000 mg | Freq: Once | INTRAVENOUS | Status: DC | PRN

## 2024-02-05 MED ORDER — DROPERIDOL 2.5 MG/ML IJ SOLN
0.6250 mg | Freq: Once | INTRAMUSCULAR | Status: DC | PRN
Start: 2024-02-05 — End: 2024-02-07

## 2024-02-05 MED ORDER — FENTANYL CITRATE (PF) 100 MCG/2ML IJ SOLN: 25.0000 ug | INTRAMUSCULAR | Status: DC | PRN

## 2024-02-05 MED ORDER — CEFAZOLIN SODIUM-DEXTROSE 2-4 GM/100ML-% IV SOLN
2.0000 g | INTRAVENOUS | Status: AC
  Administered 2024-02-05: 2 g via INTRAVENOUS

## 2024-02-05 MED ORDER — CHLORHEXIDINE GLUCONATE 0.12 % MT SOLN
OROMUCOSAL | Status: AC
  Filled 2024-02-05: qty 15

## 2024-02-05 MED ORDER — BUPIVACAINE HCL (PF) 0.25 % IJ SOLN
INTRAMUSCULAR | Status: AC
  Filled 2024-02-05: qty 10

## 2024-02-05 MED ORDER — OXYCODONE HCL 5 MG/5ML PO SOLN: 5.0000 mg | Freq: Once | ORAL | Status: DC | PRN

## 2024-02-05 MED ORDER — OXYCODONE HCL 5 MG PO TABS: 5.0000 mg | ORAL_TABLET | Freq: Once | ORAL | Status: DC | PRN

## 2024-02-05 MED ORDER — CEFAZOLIN SODIUM-DEXTROSE 2-4 GM/100ML-% IV SOLN
INTRAVENOUS | Status: AC
  Filled 2024-02-05: qty 100

## 2024-02-05 MED ORDER — CHLORHEXIDINE GLUCONATE 0.12 % MT SOLN
15.0000 mL | Freq: Once | OROMUCOSAL | Status: AC
  Administered 2024-02-05: 15 mL via OROMUCOSAL

## 2024-02-05 MED ORDER — FENTANYL CITRATE (PF) 250 MCG/5ML IJ SOLN
INTRAMUSCULAR | Status: AC
  Filled 2024-02-05: qty 5

## 2024-02-05 MED ORDER — MIDAZOLAM HCL 2 MG/2ML IJ SOLN
INTRAMUSCULAR | Status: AC
  Filled 2024-02-05: qty 2

## 2024-02-05 MED ORDER — MIDAZOLAM HCL 2 MG/2ML IJ SOLN
INTRAMUSCULAR | Status: DC | PRN
  Administered 2024-02-05: 2 mg via INTRAVENOUS

## 2024-02-05 MED ORDER — ORAL CARE MOUTH RINSE: 15.0000 mL | Freq: Once | OROMUCOSAL | Status: AC

## 2024-02-05 MED ORDER — ONDANSETRON HCL 4 MG/2ML IJ SOLN
INTRAMUSCULAR | Status: DC | PRN
  Administered 2024-02-05: 4 mg via INTRAVENOUS

## 2024-02-05 MED ORDER — LIDOCAINE 2% (20 MG/ML) 5 ML SYRINGE
INTRAMUSCULAR | Status: AC
  Filled 2024-02-05: qty 5

## 2024-02-05 MED ORDER — PROPOFOL 10 MG/ML IV BOLUS
INTRAVENOUS | Status: AC
  Filled 2024-02-05: qty 20

## 2024-02-05 SURGICAL SUPPLY — 61 items
BAG COUNTER SPONGE SURGICOUNT (BAG) ×1 IMPLANT
BNDG COHESIVE 1X5 TAN STRL LF (GAUZE/BANDAGES/DRESSINGS) IMPLANT
BNDG COMPR ESMARK 4X3 LF (GAUZE/BANDAGES/DRESSINGS) ×1 IMPLANT
BNDG ELASTIC 2INX 5YD STR LF (GAUZE/BANDAGES/DRESSINGS) IMPLANT
BNDG ELASTIC 3INX 5YD STR LF (GAUZE/BANDAGES/DRESSINGS) IMPLANT
BNDG ELASTIC 4X5.8 VLCR STR LF (GAUZE/BANDAGES/DRESSINGS) ×1 IMPLANT
BNDG GAUZE DERMACEA FLUFF 4 (GAUZE/BANDAGES/DRESSINGS) ×1 IMPLANT
CORD BIPOLAR FORCEPS 12FT (ELECTRODE) ×1 IMPLANT
COVER SURGICAL LIGHT HANDLE (MISCELLANEOUS) ×1 IMPLANT
CUFF TOURN SGL QUICK 18X4 (TOURNIQUET CUFF) ×1 IMPLANT
CUFF TRNQT CYL 24X4X16.5-23 (TOURNIQUET CUFF) IMPLANT
DRAPE SURG 17X11 SM STRL (DRAPES) ×2 IMPLANT
DRAPE SURG 17X23 STRL (DRAPES) ×1 IMPLANT
DRSG ADAPTIC 3X8 NADH LF (GAUZE/BANDAGES/DRESSINGS) IMPLANT
DRSG EMULSION OIL 3X3 NADH (GAUZE/BANDAGES/DRESSINGS) ×1 IMPLANT
ELECTRODE REM PT RTRN 9FT ADLT (ELECTROSURGICAL) ×1 IMPLANT
GAUZE PAD ABD 8X10 STRL (GAUZE/BANDAGES/DRESSINGS) ×1 IMPLANT
GAUZE SPONGE 2X2 8PLY STRL LF (GAUZE/BANDAGES/DRESSINGS) IMPLANT
GAUZE SPONGE 4X4 12PLY STRL (GAUZE/BANDAGES/DRESSINGS) ×1 IMPLANT
GAUZE XEROFORM 1X8 LF (GAUZE/BANDAGES/DRESSINGS) ×1 IMPLANT
GLOVE BIOGEL PI IND STRL 8.5 (GLOVE) ×1 IMPLANT
GLOVE SURG ORTHO 8.0 STRL STRW (GLOVE) ×1 IMPLANT
GOWN STRL REUS W/ TWL LRG LVL3 (GOWN DISPOSABLE) ×2 IMPLANT
GOWN STRL REUS W/ TWL XL LVL3 (GOWN DISPOSABLE) ×1 IMPLANT
IV NS IRRIG 3000ML ARTHROMATIC (IV SOLUTION) ×1 IMPLANT
KIT BASIN OR (CUSTOM PROCEDURE TRAY) ×1 IMPLANT
KIT TURNOVER KIT B (KITS) ×1 IMPLANT
MANIFOLD NEPTUNE II (INSTRUMENTS) ×1 IMPLANT
NDL HYPO 25GX1X1/2 BEV (NEEDLE) IMPLANT
NEEDLE HYPO 25GX1X1/2 BEV (NEEDLE) ×1 IMPLANT
NS IRRIG 1000ML POUR BTL (IV SOLUTION) ×1 IMPLANT
PACK ORTHO EXTREMITY (CUSTOM PROCEDURE TRAY) ×1 IMPLANT
PAD ARMBOARD POSITIONER FOAM (MISCELLANEOUS) ×2 IMPLANT
PAD CAST 4YDX4 CTTN HI CHSV (CAST SUPPLIES) ×1 IMPLANT
PADDING UNDERCAST 2X4 STRL (CAST SUPPLIES) IMPLANT
SET CYSTO W/LG BORE CLAMP LF (SET/KITS/TRAYS/PACK) ×1 IMPLANT
SOAP 2 % CHG 4 OZ (WOUND CARE) ×1 IMPLANT
SPECIMEN JAR SMALL (MISCELLANEOUS) ×1 IMPLANT
SPLINT FIBERGLASS 3X12 (CAST SUPPLIES) IMPLANT
SUCTION TUBE FRAZIER 10FR DISP (SUCTIONS) IMPLANT
SUT ETHILON 8 0 BV130 4 (SUTURE) IMPLANT
SUT ETHILON 9 0 BV130 4 (SUTURE) IMPLANT
SUT FIBER WIRE 4.0 (SUTURE) IMPLANT
SUT MERSILENE 4 0 P 3 (SUTURE) IMPLANT
SUT PROLENE 3 0 PS 2 (SUTURE) IMPLANT
SUT PROLENE 4 0 PS 2 18 (SUTURE) IMPLANT
SUT PROLENE 5 0 PS 2 (SUTURE) IMPLANT
SUT PROLENE 6 0 P 1 18 (SUTURE) IMPLANT
SUT VIC AB 1 CT1 27XBRD ANTBC (SUTURE) IMPLANT
SUT VIC AB 2-0 CT1 27XBRD (SUTURE) IMPLANT
SUT VIC AB 2-0 CT1 TAPERPNT 27 (SUTURE) IMPLANT
SUTURE FIBERWR 2-0 18 17.9 3/8 (SUTURE) IMPLANT
SUTURE FIBERWR 3-0 18 TAPR NDL (SUTURE) IMPLANT
SUTURE FIBERWR 4-0 18 TAPR NDL (SUTURE) IMPLANT
SYR 20ML LL LF (SYRINGE) ×1 IMPLANT
SYR CONTROL 10ML LL (SYRINGE) ×1 IMPLANT
TOWEL GREEN STERILE (TOWEL DISPOSABLE) ×1 IMPLANT
TOWEL GREEN STERILE FF (TOWEL DISPOSABLE) ×1 IMPLANT
TUBE CONNECTING 12X1/4 (SUCTIONS) IMPLANT
UNDERPAD 30X36 HEAVY ABSORB (UNDERPADS AND DIAPERS) ×1 IMPLANT
WATER STERILE IRR 1000ML POUR (IV SOLUTION) ×1 IMPLANT

## 2024-02-05 NOTE — Transfer of Care (Signed)
 Immediate Anesthesia Transfer of Care Note  Patient: Ashlee Graham  Procedure(s) Performed: WOUND EXPLORATION (Right) TENDON REPAIR (Right)  Patient Location: PACU  Anesthesia Type:MAC  Level of Consciousness: awake, alert , and oriented  Airway & Oxygen Therapy: Patient Spontanous Breathing  Post-op Assessment: Report given to RN and Post -op Vital signs reviewed and stable  Post vital signs: Reviewed and stable  Last Vitals:  Vitals Value Taken Time  BP 107/74 02/05/24 15:08  Temp    Pulse 58 02/05/24 15:10  Resp 20 02/05/24 15:10  SpO2 98 % 02/05/24 15:10  Vitals shown include unfiled device data.  Last Pain:  Vitals:   02/05/24 1254  TempSrc: Oral         Complications: No notable events documented.

## 2024-02-05 NOTE — Anesthesia Preprocedure Evaluation (Addendum)
 Anesthesia Evaluation  Patient identified by MRN, date of birth, ID band Patient awake    Reviewed: Allergy & Precautions, H&P , NPO status , Patient's Chart, lab work & pertinent test results  History of Anesthesia Complications Negative for: history of anesthetic complications  Airway Mallampati: II  TM Distance: >3 FB Neck ROM: Full    Dental no notable dental hx.    Pulmonary neg pulmonary ROS   Pulmonary exam normal breath sounds clear to auscultation       Cardiovascular (-) hypertension(-) angina (-) Past MI negative cardio ROS Normal cardiovascular exam Rhythm:Regular Rate:Normal     Neuro/Psych negative neurological ROS  negative psych ROS   GI/Hepatic negative GI ROS, Neg liver ROS,,,  Endo/Other  negative endocrine ROS    Renal/GU negative Renal ROS   endometriosis    Musculoskeletal negative musculoskeletal ROS (+)    Abdominal   Peds negative pediatric ROS (+)  Hematology negative hematology ROS (+)   Anesthesia Other Findings   Reproductive/Obstetrics negative OB ROS                              Anesthesia Physical Anesthesia Plan  ASA: 2  Anesthesia Plan: MAC   Post-op Pain Management:    Induction: Intravenous  PONV Risk Score and Plan: Propofol  infusion and Treatment may vary due to age or medical condition  Airway Management Planned: Natural Airway  Additional Equipment:   Intra-op Plan:   Post-operative Plan:   Informed Consent: I have reviewed the patients History and Physical, chart, labs and discussed the procedure including the risks, benefits and alternatives for the proposed anesthesia with the patient or authorized representative who has indicated his/her understanding and acceptance.     Dental advisory given  Plan Discussed with: CRNA  Anesthesia Plan Comments:         Anesthesia Quick Evaluation

## 2024-02-05 NOTE — Op Note (Signed)
 PREOPERATIVE DIAGNOSIS:Right thumb hand laceration with tendon involvement  POSTOPERATIVE DIAGNOSIS:RIght thumb and wrist laceration with tendon involvement  ATTENDING SURGEON:Dr. Prentice Pagan who was scrubbed and present for the entire procedure  ASSISTANT SURGEON:None  ANESTHESIA:Local with IV sedation  OPERATIVE PROCEDURE: Right wrist ECRL repair, extensor tendon Right dorsum of hand at [facility] region, EPL repair Right hand laceration repair 3 cm repair  IMPLANTS:none  ZAO:[id number]  RADIOGRAPHIC INTERPRETATION:none  SURGICAL INDICATIONS: Patient is a right-hand-dominant female who was at home this morning sustaining the sharp laceration of the dorsal aspect of her hand.  The patient was unable to extend her thumb.  Patient was seen and evaluated and recommend undergo the above procedure.  Risks of surgery include but not limited to bleeding infection damage nearby nerves arteries or tendons loss of motion of the wrist and digits incomplete relief of symptoms and need for further surgical invention.  A signed informed consent was obtained on the day of surgery.  SURGICAL TECHNIQUE: The patient was prepped identified in the preoperative holding area marked the prior marker made on the right thumb to indicate the correct operative site.  The patient brought back the operating placed supine on the anesthesia table where the local anesthetic was administered.  1% Xylocaine  core percent Marcaine  local block.  IV sedation was administered.  A well-padded tourniquet was then placed on the right forearm and sealed with the appropriate drape.  The right upper extremities then prepped and draped normal sterile fashion.  A timeout was called the correct site was identified procedure then began.  The oblique laceration directly over the EPL tendon course at the [facility] region was then extended proximally distally.  The patient did have transection of the ECRL as well as the EPL.  The superficial branch of the  radial nerve was then carefully identified and protected throughout.  This was in continuity.  Following this tendon repair was then done of the ECRL.  Using 2 horizontal mattress cord FiberWire sutures 4.0 these were then used for the core sutures this was then supplemented by running locking 4-0 FiberWire suture to complete the construct.  Following repair of the ECRL attention was then turned to the EPL once again FiberWire suture was then used with 2 core horizontal mattress sutures supplemented by a running locking suture.  The wounds were then thoroughly irrigated.  The laceration traumatic laceration measuring 3 cm was then repaired with simple and horizontal mattress Prolene sutures.  Adaptic dressing sterile compressive bandage then applied.  At the conclusion of the procedure the patient was able to extend her thumb she had good active mobility of her thumb following repair.  There was good tenodesis effect with gentle flexion and extension of the wrist.  Patient was then placed in a well-padded thumb spica splint patient was then taken recovery in good condition.  POSTOPERATIVE PLAN: Patient be discharged to home.  See her back in the office in 6 days for wound check down to see our therapist begin a postoperative EPL repair protocol.  This is in the region of the dorsum of the [facility] region zone 6.  No radiographs at the first visit.

## 2024-02-05 NOTE — Anesthesia Postprocedure Evaluation (Signed)
 Anesthesia Post Note  Patient: Particia Claudene Kerns  Procedure(s) Performed: WOUND EXPLORATION (Right) TENDON REPAIR (Right)     Patient location during evaluation: PACU Anesthesia Type: MAC Level of consciousness: awake and alert Pain management: pain level controlled Vital Signs Assessment: post-procedure vital signs reviewed and stable Respiratory status: spontaneous breathing, nonlabored ventilation, respiratory function stable and patient connected to nasal cannula oxygen Cardiovascular status: stable and blood pressure returned to baseline Postop Assessment: no apparent nausea or vomiting Anesthetic complications: no   No notable events documented.  Last Vitals:  Vitals:   02/05/24 1515 02/05/24 1530  BP: 109/72 107/65  Pulse: 60 62  Resp: 15 13  Temp:  36.6 C  SpO2: 97% 98%    Last Pain:  Vitals:   02/05/24 1530  TempSrc:   PainSc: 0-No pain                 Thom JONELLE Peoples

## 2024-02-05 NOTE — H&P (Signed)
 Ashlee Graham is an 53 y.o. female.   Chief Complaint: Right hand laceration HPI: Pt working in house today Object fell on right hand and laceration occurred Pt unable to extend the right thumb  Past Medical History:  Diagnosis Date   History of endometriosis    Pelvic pain in female    Rotator cuff syndrome 03/30/2012   This is now chronic for 7 months involving the left shoulder. I suspect at the onset of the injury she probably had a partial tear of her supraspinatous and infraspinatus that appear to have essentially healed but left her with some chronic tendinopathy.    Shoulder pain, left 03/30/2012   This is chronic now for 7 months but does respond to ibuprofen     Past Surgical History:  Procedure Laterality Date   CESAREAN SECTION  03-03-2006   LAPAROSCOPY N/A 08/29/2015   Procedure: LAPAROSCOPY DIAGNOSTIC , drainage of left ovarian cyst;  Surgeon: Rosaline Cobble, MD;  Location: [facility];  Service: Gynecology;  Laterality: N/A;   LAPAROSCOPY W/ FULGERATION ENDOMETRIOSIS  10-02-2004    Family History  Problem Relation Age of Onset   Heart attack Father 60       deceased   Social History:  reports that she has never smoked. She has never used smokeless tobacco. She reports that she does not drink alcohol and does not use drugs.  Allergies:  Allergies  Allergen Reactions   Ampicillin Other (See Comments)    Severe chest pain   Penicillins Other (See Comments)    Unknown childhood reaction    No medications prior to admission.    No results found for this or any previous visit (from the past 48 hours). No results found.  ROS: No recent illnesses or hospitalizations  There were no vitals taken for this visit. Physical Exam  General Appearance:  Alert, cooperative, no distress, appears stated age  Head:  Normocephalic, without obvious abnormality, atraumatic  Eyes:  Pupils equal, conjunctiva/corneas clear,         Throat: Lips, mucosa,  and tongue normal; teeth and gums normal  Neck: No visible masses     Lungs:   respirations unlabored  Chest Wall:  No tenderness or deformity  Heart:  Regular rate and rhythm,  Abdomen:   Soft, non-tender,         Extremities: Unable to extend thumb from palm flat position, good thumb ip flexion Good digital mobility  Pulses: 2+ and symmetric  Skin: Skin color, texture, turgor normal, no rashes or lesions     Neurologic: Normal     Assessment/Plan Right hand laceration with tendon involvement  Right hand wound exploration and tendon repair  R/B/A DISCUSSED WITH PT IN OFFICE.  PT VOICED UNDERSTANDING OF PLAN CONSENT SIGNED DAY OF SURGERY PT SEEN AND EXAMINED PRIOR TO OPERATIVE PROCEDURE/DAY OF SURGERY SITE MARKED. QUESTIONS ANSWERED WILL GO HOME FOLLOWING SURGERY   WE ARE PLANNING SURGERY FOR YOUR UPPER EXTREMITY. THE RISKS AND BENEFITS OF SURGERY INCLUDE BUT NOT LIMITED TO BLEEDING INFECTION, DAMAGE TO NEARBY NERVES ARTERIES TENDONS, FAILURE OF SURGERY TO ACCOMPLISH ITS INTENDED GOALS, PERSISTENT SYMPTOMS AND NEED FOR FURTHER SURGICAL INTERVENTION. WITH THIS IN MIND WE WILL PROCEED. I HAVE DISCUSSED WITH THE PATIENT THE PRE AND POSTOPERATIVE REGIMEN AND THE DOS AND DON'TS. PT VOICED UNDERSTANDING AND INFORMED CONSENT SIGNED.   Ashlee Graham 02/05/2024, 1216 PM

## 2024-02-05 NOTE — Discharge Instructions (Signed)
KEEP BANDAGE CLEAN AND DRY CALL OFFICE FOR F/U APPT [phone] in 6 days KEEP HAND ELEVATED ABOVE HEART OK TO APPLY ICE TO OPERATIVE AREA CONTACT OFFICE IF ANY WORSENING PAIN OR CONCERNS.  

## 2024-02-05 NOTE — H&P (Addendum)
 PREOPERATIVE DIAGNOSIS:Right thumb hand laceration with tendon involvement  POSTOPERATIVE DIAGNOSIS:RIght thumb and wrist laceration with tendon involvement  ATTENDING SURGEON:Dr. Prentice Pagan who was scrubbed and present for the entire procedure  ASSISTANT SURGEON:None  ANESTHESIA:Local with IV sedation  OPERATIVE PROCEDURE: Right wrist ECRL repair, extensor tendon Right dorsum of hand at [facility] region, EPL repair Right hand laceration repair 3 cm repair  IMPLANTS:none  ZAO:[id number]  RADIOGRAPHIC INTERPRETATION:none  SURGICAL INDICATIONS: Patient is a right-hand-dominant female who was at home this morning sustaining the sharp laceration of the dorsal aspect of her hand.  The patient was unable to extend her thumb.  Patient was seen and evaluated and recommend undergo the above procedure.  Risks of surgery include but not limited to bleeding infection damage nearby nerves arteries or tendons loss of motion of the wrist and digits incomplete relief of symptoms and need for further surgical invention.  A signed informed consent was obtained on the day of surgery.  SURGICAL TECHNIQUE: The patient was prepped identified in the preoperative holding area marked the prior marker made on the right thumb to indicate the correct operative site.  The patient brought back the operating placed supine on the anesthesia table where the local anesthetic was administered.  1% Xylocaine  core percent Marcaine  local block.  IV sedation was administered.  A well-padded tourniquet was then placed on the right forearm and sealed with the appropriate drape.  The right upper extremities then prepped and draped normal sterile fashion.  A timeout was called the correct site was identified procedure then began.  The oblique laceration directly over the EPL tendon course at the [facility] region was then extended proximally distally.  The patient did have transection of the ECRL as well as the EPL.  The superficial branch of the  radial nerve was then carefully identified and protected throughout.  This was in continuity.  Following this tendon repair was then done of the ECRL.  Using 2 horizontal mattress cord FiberWire sutures 4.0 these were then used for the core sutures this was then supplemented by running locking 4-0 FiberWire suture to complete the construct.  Following repair of the ECRL attention was then turned to the EPL once again FiberWire suture was then used with 2 core horizontal mattress sutures supplemented by a running locking suture.  The wounds were then thoroughly irrigated.  The laceration traumatic laceration measuring 3 cm was then repaired with simple and horizontal mattress Prolene sutures.  Adaptic dressing sterile compressive bandage then applied.  At the conclusion of the procedure the patient was able to extend her thumb she had good active mobility of her thumb following repair.  There was good tenodesis effect with gentle flexion and extension of the wrist.  Patient was then placed in a well-padded thumb spica splint patient was then taken recovery in good condition.  POSTOPERATIVE PLAN: Patient be discharged to home.  See her back in the office in 6 days for wound check down to see our therapist begin a postoperative EPL repair protocol.  This is in the region of the dorsum of the [facility] region zone 6.  No radiographs at the first visit.

## 2024-02-06 ENCOUNTER — Encounter (HOSPITAL_COMMUNITY): Payer: Self-pay | Admitting: Orthopedic Surgery

## 2024-03-02 ENCOUNTER — Ambulatory Visit: Admission: EM | Admit: 2024-03-02 | Discharge: 2024-03-02 | Disposition: A | Discharge status: Home / Self Care

## 2024-03-02 ENCOUNTER — Encounter: Payer: Self-pay | Admitting: Emergency Medicine

## 2024-03-02 DIAGNOSIS — S51852A Open bite of left forearm, initial encounter: Secondary | ICD-10-CM | POA: Diagnosis not present

## 2024-03-02 DIAGNOSIS — W5501XA Bitten by cat, initial encounter: Secondary | ICD-10-CM

## 2024-03-02 MED ORDER — AMOXICILLIN-POT CLAVULANATE 875-125 MG PO TABS: 1.0000 | ORAL_TABLET | Freq: Two times a day (BID) | ORAL | 0 refills | Status: DC

## 2024-03-02 MED ORDER — METRONIDAZOLE 500 MG PO TABS: 500.0000 mg | ORAL_TABLET | Freq: Two times a day (BID) | ORAL | 0 refills | Status: AC

## 2024-03-02 MED ORDER — CEPHALEXIN 500 MG PO CAPS: 500.0000 mg | ORAL_CAPSULE | Freq: Three times a day (TID) | ORAL | 0 refills | Status: AC

## 2024-03-02 NOTE — Discharge Instructions (Addendum)
  1. Cat bite, initial encounter (Primary) - cephALEXin  (KEFLEX ) 500 MG capsule; Take 1 capsule (500 mg total) by mouth 3 (three) times daily for 10 days.  Dispense: 30 capsule; Refill: 0 - metroNIDAZOLE  (FLAGYL ) 500 MG tablet; Take 1 tablet (500 mg total) by mouth 2 (two) times daily for 10 days.  Dispense: 20 tablet; Refill: 0 - Take ibuprofen 600 mg every 6-8 hours as needed for pain and inflammation secondary to cat bite. - Continue to monitor symptoms for any change in severity if there is any escalation recurrent symptoms or development of new symptoms follow-up in ER for further evaluation and treatment.

## 2024-03-02 NOTE — ED Triage Notes (Signed)
 Pt presents with cat bite to left wrist that happened today. Pt was bit by her personal cat while trying to remove mouse trap from its tail.  Pt cleaned wound with soap/water, peroxide, and applied abx ointment.  Site is red and swollen

## 2024-03-02 NOTE — ED Provider Notes (Addendum)
 [facility]  Note:  This document was prepared using Dragon voice recognition software and may include unintentional dictation errors.  MRN: [id number] DOB: 1970-12-02  Subjective:   Ashlee Graham is a 53 y.o. female presenting for cat bite to the left forearm that occurred earlier today.  Patient states that she was trying to remove a mouse trap from her cat's tail when her cat bit her on the wrist/lower forearm.  Patient reports severe pain secondary to the bite.  Patient reports that there was immediate swelling and redness and she is concerned for secondary infection.  Patient reports that her cat is up-to-date on all the shots and there is no risk for rabies.  No current facility-administered medications for this encounter.  Current Outpatient Medications:    cephALEXin  (KEFLEX ) 500 MG capsule, Take 1 capsule (500 mg total) by mouth 3 (three) times daily for 10 days., Disp: 30 capsule, Rfl: 0   metroNIDAZOLE  (FLAGYL ) 500 MG tablet, Take 1 tablet (500 mg total) by mouth 2 (two) times daily for 10 days., Disp: 20 tablet, Rfl: 0   ALPRAZolam (XANAX) 0.5 MG tablet, TAKE 1 TABLET EVERY 8 HOURS AS NEEDED FOR ANXIETY, Disp: , Rfl:    diphenhydrAMINE (BENADRYL ALLERGY) 25 mg capsule, Take 25 mg by mouth as needed., Disp: , Rfl:    estradiol (VIVELLE-DOT) 0.0375 MG/24HR, Place 1 patch onto the skin 2 (two) times a week., Disp: , Rfl:    gabapentin (NEURONTIN) 100 MG capsule, Take 100 mg by mouth as needed. (Patient not taking: Reported on 11/09/2023), Disp: , Rfl:    ibuprofen (ADVIL,MOTRIN) 200 MG tablet, Take 200 mg by mouth every 6 (six) hours as needed., Disp: , Rfl:    itraconazole (SPORANOX) 100 MG capsule, Take 100 mg by mouth 2 (two) times daily. (Patient not taking: Reported on 11/09/2023), Disp: , Rfl:    metoprolol  tartrate (LOPRESSOR ) 25 MG tablet, Take 1 tablet (25 mg total) by mouth 2 (two) times daily., Disp: 180 tablet, Rfl: 3   pantoprazole  (PROTONIX ) 40 MG  tablet, Take 1 tablet (40 mg total) by mouth daily. (Patient not taking: Reported on 11/09/2023), Disp: 30 tablet, Rfl: 0   progesterone (PROMETRIUM) 200 MG capsule, Take 200 mg by mouth daily., Disp: , Rfl:    Allergies  Allergen Reactions   Ampicillin Other (See Comments)    Severe chest pain   Penicillins Other (See Comments)    Unknown childhood reaction    Past Medical History:  Diagnosis Date   Dysrhythmia    History of endometriosis    Pelvic pain in female    Rotator cuff syndrome 03/30/2012   This is now chronic for 7 months involving the left shoulder. I suspect at the onset of the injury she probably had a partial tear of her supraspinatous and infraspinatus that appear to have essentially healed but left her with some chronic tendinopathy.    Shoulder pain, left 03/30/2012   This is chronic now for 7 months but does respond to ibuprofen      Past Surgical History:  Procedure Laterality Date   CESAREAN SECTION  03/03/2006   LAPAROSCOPY N/A 08/29/2015   Procedure: LAPAROSCOPY DIAGNOSTIC , drainage of left ovarian cyst;  Surgeon: Rosaline Cobble, MD;  Location: [facility];  Service: Gynecology;  Laterality: N/A;   LAPAROSCOPY W/ FULGERATION ENDOMETRIOSIS  10/02/2004   TENDON REPAIR Right 02/05/2024   Procedure: TENDON REPAIR;  Surgeon: Shari Easter, MD;  Location: [facility] OR;  Service:  Orthopedics;  Laterality: Right;   WOUND EXPLORATION Right 02/05/2024   Procedure: WOUND EXPLORATION;  Surgeon: Shari Easter, MD;  [facility] OR;  Service: Orthopedics;  Laterality: Right;  RIGHT HAND WOUND    Family History  Problem Relation Age of Onset   Heart attack Father 62       deceased    Social History   Tobacco Use   Smoking status: Never   Smokeless tobacco: Never  Substance Use Topics   Alcohol use: Yes    Comment: twice a month   Drug use: Never    ROS Refer to HPI for ROS details.  Objective:    Vitals: BP 115/79 (BP Location: Right Arm)    Pulse 70   Temp 98.4 F (36.9 C) (Oral)   Resp 16   LMP 09/21/2018 (Exact Date)   SpO2 96%   Physical Exam Vitals and nursing note reviewed.  Constitutional:      General: She is not in acute distress.    Appearance: Normal appearance. She is not ill-appearing.  HENT:     Head: Normocephalic.  Cardiovascular:     Rate and Rhythm: Normal rate.  Pulmonary:     Effort: Pulmonary effort is normal. No respiratory distress.  Skin:    General: Skin is warm and dry.     Capillary Refill: Capillary refill takes less than 2 seconds.     Findings: Erythema, signs of injury and wound present.      Neurological:     General: No focal deficit present.     Mental Status: She is alert and oriented to person, place, and time.  Psychiatric:        Mood and Affect: Mood normal.        Behavior: Behavior normal.     Procedures  No results found for this or any previous visit (from the past 24 hours).  Assessment and Plan :     Discharge Instructions       1. Cat bite, initial encounter (Primary) - cephALEXin  (KEFLEX ) 500 MG capsule; Take 1 capsule (500 mg total) by mouth 3 (three) times daily for 10 days.  Dispense: 30 capsule; Refill: 0 - metroNIDAZOLE  (FLAGYL ) 500 MG tablet; Take 1 tablet (500 mg total) by mouth 2 (two) times daily for 10 days.  Dispense: 20 tablet; Refill: 0 - Take ibuprofen 600 mg every 6-8 hours as needed for pain and inflammation secondary to cat bite. - Continue to monitor symptoms for any change in severity if there is any escalation recurrent symptoms or development of new symptoms follow-up in ER for further evaluation and treatment.      Nathalie Cavendish B Baylei Siebels   Gavinn Collard, [place] B, NP 03/02/24 1803    Aurea Goodell B, NP 03/02/24 1812

## 2024-03-03 ENCOUNTER — Telehealth: Payer: Self-pay

## 2024-03-03 NOTE — Telephone Encounter (Addendum)
 Patient currently on phone with patient access, Danita, at this time. Pt is wanting to speak with the nurse about her concerns. Name and DOB verified prior to speaking. Pt explains she was seen here yesterday 8/21 for cat bite on left forearm. States the area is becoming more red and swollen. Prescribed Keflex , Flagyl , and Ibuprofen. Three doses of each taken. Pt is asking when she should consider going to the ED for IV antibiotics. This RN spoke with Rocky Mecum PA. PA states to take antibiotics as prescribed for the next two days. If symptoms are beginning to worsen (presence of drainage, pus, increase in swelling/redness, etc.) go to ED. This information was relayed back to pt. Pt verbalized understanding. No further questions/concerns.

## 2024-03-16 ENCOUNTER — Encounter: Payer: Self-pay | Admitting: Cardiology

## 2024-06-02 ENCOUNTER — Telehealth: Payer: Self-pay

## 2024-06-02 NOTE — Telephone Encounter (Signed)
 Patient called the office to schedule a NPT FU

## 2024-07-08 ENCOUNTER — Other Ambulatory Visit (HOSPITAL_BASED_OUTPATIENT_CLINIC_OR_DEPARTMENT_OTHER): Payer: Self-pay

## 2024-07-11 ENCOUNTER — Other Ambulatory Visit (HOSPITAL_BASED_OUTPATIENT_CLINIC_OR_DEPARTMENT_OTHER): Payer: Self-pay

## 2024-07-11 ENCOUNTER — Other Ambulatory Visit: Payer: Self-pay

## 2024-07-11 MED ORDER — ESTRADIOL 0.0375 MG/24HR TD PTTW
1.0000 | MEDICATED_PATCH | TRANSDERMAL | 3 refills | Status: AC
  Filled 2024-07-11: qty 8, 28d supply, fill #0

## 2024-07-27 ENCOUNTER — Ambulatory Visit: Payer: PRIVATE HEALTH INSURANCE

## 2024-07-27 ENCOUNTER — Other Ambulatory Visit (HOSPITAL_BASED_OUTPATIENT_CLINIC_OR_DEPARTMENT_OTHER): Payer: Self-pay

## 2024-07-27 VITALS — BP 106/74 | HR 67 | Temp 97.5°F | Resp 16 | Ht 68.5 in | Wt 165.4 lb

## 2024-07-27 DIAGNOSIS — R5382 Chronic fatigue, unspecified: Secondary | ICD-10-CM

## 2024-07-27 DIAGNOSIS — Z8249 Family history of ischemic heart disease and other diseases of the circulatory system: Secondary | ICD-10-CM | POA: Diagnosis not present

## 2024-07-27 DIAGNOSIS — M25541 Pain in joints of right hand: Secondary | ICD-10-CM

## 2024-07-27 DIAGNOSIS — F419 Anxiety disorder, unspecified: Secondary | ICD-10-CM | POA: Insufficient documentation

## 2024-07-27 NOTE — Progress Notes (Signed)
 "   Consult Note  Patient: Ashlee Graham             Date of Birth: 11-May-1971           MRN: [id number]             Visit Date: 07/27/2024  Referring Provider: Mat Browning, MD  Thank you for entrusting me in the care of this patient! Below are my findings:  Subjective:   Chief Complaint: New Patient (Initial Visit) (Injury to right hand, had surgery on it and surgeon referred her due to RA in family history.)  History of Present Illness: Ashlee Graham is a 54 y.o. female who presents to the clinic today for evaluation of right hand pain and swelling. She suffered a laceration to her right hand and is s/p right wrist ECRL repair of the extensor tendon and EPL repair of the right dorsum of the hand at the [facility] region on 02/05/2024; however after 4 months the thumb tendon failed and they had to do another repair. Since her first surgery, she endorses hand pain, swelling, and tenderness across her MCPs and PIPs in her right hand. She denies any pain or swelling of her left hand. She is currently working with a hand physical therapist to increase her range of motion and dexterity. She endorses joint stiffness, morning stiffness, and pain in her upper back, neck, and shoulders for the past 3 years. Morning stiffness lasts 1-2 hours. She also endorses fatigue that is unrelenting and has been present for the past 3 years. In regards to her family history, she has a positive family history of rheumatoid arthritis in her maternal grandmother. Her father died at an early age of a heart attack.   Of note, she had been seeing [facility] for hormone replacement therapy. This is now managed by her OBGYN.   Activities of Daily Living:  Patient reports morning stiffness for 1-2 hours.   Patient Denies nocturnal pain.  Difficulty dressing/grooming: Reports Difficulty climbing stairs: Denies Difficulty getting out of chair: Denies Difficulty using hands for taps, buttons, cutlery,  and/or writing: Reports  Labs From Referral: none.  Review of Systems: Review of Systems  Constitutional:  Positive for fatigue.  HENT:  Positive for mouth dryness. Negative for mouth sores.   Eyes:  Negative for dryness.  Respiratory:  Negative for shortness of breath.   Cardiovascular:  Positive for palpitations. Negative for chest pain.  Gastrointestinal:  Negative for blood in stool, constipation and diarrhea.  Endocrine: Negative for increased urination.  Genitourinary:  Negative for involuntary urination.  Musculoskeletal:  Positive for joint pain, joint pain, joint swelling, muscle weakness and morning stiffness. Negative for gait problem, myalgias, muscle tenderness and myalgias.  Skin:  Positive for hair loss. Negative for color change, rash and sensitivity to sunlight.  Allergic/Immunologic: Negative for susceptible to infections.  Neurological:  Positive for headaches. Negative for dizziness.  Hematological:  Negative for swollen glands.  Psychiatric/Behavioral:  Positive for depressed mood. Negative for sleep disturbance. The patient is nervous/anxious.    Medication List: Current Medications[1]   Allergies:  Ampicillin and Penicillins  Immunization status:   There is no immunization history on file for this patient.   Problem List:  Patient Active Problem List   Diagnosis Date Noted   Anxiety 07/27/2024   Laceration of tendon of right hand 02/05/2024   Hypermobility syndrome 01/12/2023   DDD (degenerative disc disease), cervical 12/04/2021   History: Past Medical History:  Diagnosis  Date   Dysrhythmia    History of endometriosis    Pelvic pain in female    Rotator cuff syndrome 03/30/2012   This is now chronic for 7 months involving the left shoulder. I suspect at the onset of the injury she probably had a partial tear of her supraspinatous and infraspinatus that appear to have essentially healed but left her with some chronic tendinopathy.    Shoulder  pain, left 03/30/2012   This is chronic now for 7 months but does respond to ibuprofen     Family History  Problem Relation Age of Onset   Cancer Mother        Breast   Heart attack Father 66       deceased   Rheum arthritis Maternal Grandmother    Cancer Paternal Grandmother        Colon   Heart failure Paternal Grandfather    Healthy Daughter    Past Surgical History:  Procedure Laterality Date   CESAREAN SECTION  03/03/2006   LAPAROSCOPY N/A 08/29/2015   Procedure: LAPAROSCOPY DIAGNOSTIC , drainage of left ovarian cyst;  Surgeon: Rosaline Cobble, MD;  Location: [facility];  Service: Gynecology;  Laterality: N/A;   LAPAROSCOPY W/ FULGERATION ENDOMETRIOSIS  10/02/2004   TENDON REPAIR Right 02/05/2024   Procedure: TENDON REPAIR;  Surgeon: Shari Easter, MD;  Location: [facility] OR;  Service: Orthopedics;  Laterality: Right;   WOUND EXPLORATION Right 02/05/2024   Procedure: WOUND EXPLORATION;  Surgeon: Shari Easter, MD;  Location: [facility] OR;  Service: Orthopedics;  Laterality: Right;  RIGHT HAND WOUND   Social History   Social History Narrative   Not on file   Objective: Vital Signs: BP 106/74 (BP Location: Left Arm, Patient Position: Sitting, Cuff Size: Normal)   Pulse 67   Temp (!) 97.5 F (36.4 C)   Resp 16   Ht 5' 8.5 (1.74 m)   Wt 165 lb 6.4 oz (75 kg)   LMP 09/21/2018   BMI 24.78 kg/m   Physical Exam Vitals reviewed.  Constitutional:      General: She is not in acute distress.    Appearance: Normal appearance. She is well-developed and normal weight.  HENT:     Head: Normocephalic and atraumatic. No right periorbital erythema or left periorbital erythema.     Mouth/Throat:     Mouth: Mucous membranes are moist.     Pharynx: Oropharynx is clear.  Eyes:     General: Lids are normal.        Right eye: No discharge.        Left eye: No discharge.     Extraocular Movements: Extraocular movements intact.     Conjunctiva/sclera: Conjunctivae normal.      Pupils: Pupils are equal, round, and reactive to light.  Cardiovascular:     Rate and Rhythm: Normal rate and regular rhythm.     Heart sounds: Normal heart sounds. No murmur heard.    No friction rub. No gallop.  Pulmonary:     Effort: Pulmonary effort is normal. No respiratory distress.     Breath sounds: Normal breath sounds. No stridor. No wheezing, rhonchi or rales.  Chest:     Chest wall: No deformity.  Musculoskeletal:     Right lower leg: No edema.     Left lower leg: No edema.     Comments: Synovitis and tenderness as noted in the homonculous below. C-spine, thoracic spine, lumbar spine have good range of motion. MCPs, PIPs, DIPs of  the right hand have very limited range of motion due to recent tendon repair. Normal range of motion on the left. Complete fist formation on the left but not on the right. Hip joints have good range of motion with no pain with internal and external rotation.  Knee joints have good range of motion with no warmth, effusion, or crepitus. Ankle joints have good range of motion with no tenderness or joint swelling. No evidence of Achilles tendinitis or plantar fasciitis. Negative MTP squeeze.   Lymphadenopathy:     Cervical: No cervical adenopathy.  Skin:    General: Skin is warm and moist.     Capillary Refill: Capillary refill takes less than 2 seconds.     Findings: No rash.     Comments: Surgical scar on right wrist healing appropriately.  Neurological:     Mental Status: She is alert.     Gait: Gait normal.  Psychiatric:        Mood and Affect: Mood normal.        Behavior: Behavior normal.        Imaging: No results found. Assessment & Plan Arthralgia of right hand Post-surgical inflammation and joint pain present in uninvolved fingers and joints. Synovitis and tenderness present on exam today. Symptoms of fatigue, morning stiffness lasting greater than 1 hour, and joint stiffness and tenderness of greater than 6 weeks. Patient also has a  positive family history of rheumatoid arthritis. Will check labs today. If serology comes back normal, I would be interested to look for underlying soft tissue swelling with ultrasound. Gave recommendations for anti-inflammatory diet and turmeric/ginger supplements.  Orders:   Comprehensive metabolic panel with GFR   ANA   Sedimentation rate   Rheumatoid factor   Cyclic citrul peptide antibody, IgG   C-reactive protein  Chronic fatigue Patient has unrelenting fatigue that has continued for greater than 3 years. As far as I can see in her chart, she has never had any iron studies. Last TSH was normal on 11/09/2023, however I want to make sure I am not missing any potential causes.  Orders:   CBC with Differential/Platelet   Iron, TIBC and Ferritin Panel   TSH  Family history of early CAD  Orders:   Lipid panel   HgB A1c   Follow-Up Instructions:  Return in about 4 weeks (around 08/24/2024) for NPT follow up.   Procedures: No procedures performed  Daved GORMAN Holstein, PA-C  Note - This record has been created using [employer]. Chart creation errors have been sought, but may not always have been located. Such creation errors do not reflect on the standard of medical care.        [1]  Current Outpatient Medications:    diphenhydrAMINE (BENADRYL ALLERGY) 25 mg capsule, Take 25 mg by mouth as needed., Disp: , Rfl:    estradiol  (VIVELLE -DOT) 0.0375 MG/24HR, Place 1 patch onto the skin 2 (two) times a week., Disp: , Rfl:    estradiol  (VIVELLE -DOT) 0.0375 MG/24HR, Place 1 patch onto the skin 2 (two) times a week., Disp: 8 patch, Rfl: 3   gabapentin (NEURONTIN) 300 MG capsule, TAKE 1 CAPSULE BY MOUTH EVERYDAY AT BEDTIME, Disp: , Rfl:    ibuprofen (ADVIL,MOTRIN) 200 MG tablet, Take 200 mg by mouth every 6 (six) hours as needed., Disp: , Rfl:    progesterone (PROMETRIUM) 200 MG capsule, Take 200 mg by mouth daily., Disp: , Rfl:   "

## 2024-07-27 NOTE — Patient Instructions (Addendum)
 [email or web address]  [email or web address]  Qunol Turmeric with ginger

## 2024-07-30 ENCOUNTER — Ambulatory Visit: Payer: Self-pay

## 2024-07-30 LAB — LIPID PANEL
Cholesterol: 237 mg/dL — ABNORMAL HIGH
HDL: 64 mg/dL
LDL Cholesterol (Calc): 153 mg/dL — ABNORMAL HIGH
Non-HDL Cholesterol (Calc): 173 mg/dL — ABNORMAL HIGH
Total CHOL/HDL Ratio: 3.7 (calc)
Triglycerides: 94 mg/dL

## 2024-07-30 LAB — IRON,TIBC AND FERRITIN PANEL
%SAT: 23 % (ref 16–45)
Ferritin: 49 ng/mL (ref 16–232)
Iron: 70 ug/dL (ref 45–160)
TIBC: 304 ug/dL (ref 250–450)

## 2024-07-30 LAB — CBC WITH DIFFERENTIAL/PLATELET
Absolute Lymphocytes: 1581 {cells}/uL (ref 850–3900)
Absolute Monocytes: 408 {cells}/uL (ref 200–950)
Basophils Absolute: 82 {cells}/uL (ref 0–200)
Basophils Relative: 1.6 %
Eosinophils Absolute: 219 {cells}/uL (ref 15–500)
Eosinophils Relative: 4.3 %
HCT: 41.4 % (ref 35.9–46.0)
Hemoglobin: 13.4 g/dL (ref 11.7–15.5)
MCH: 29 pg (ref 27.0–33.0)
MCHC: 32.4 g/dL (ref 31.6–35.4)
MCV: 89.6 fL (ref 81.4–101.7)
MPV: 10.4 fL (ref 7.5–12.5)
Monocytes Relative: 8 %
Neutro Abs: 2810 {cells}/uL (ref 1500–7800)
Neutrophils Relative %: 55.1 %
Platelets: 292 Thousand/uL (ref 140–400)
RBC: 4.62 Million/uL (ref 3.80–5.10)
RDW: 12.9 % (ref 11.0–15.0)
Total Lymphocyte: 31 %
WBC: 5.1 Thousand/uL (ref 3.8–10.8)

## 2024-07-30 LAB — RHEUMATOID FACTOR: Rheumatoid fact SerPl-aCnc: <10 [IU]/mL

## 2024-07-30 LAB — TSH: TSH: 3.35 m[IU]/L

## 2024-07-30 LAB — COMPREHENSIVE METABOLIC PANEL WITH GFR
AG Ratio: 1.7 (calc) (ref 1.0–2.5)
ALT: 12 U/L (ref 6–29)
AST: 16 U/L (ref 10–35)
Albumin: 4.3 g/dL (ref 3.6–5.1)
Alkaline phosphatase (APISO): 59 U/L (ref 37–153)
BUN: 14 mg/dL (ref 7–25)
CO2: 29 mmol/L (ref 20–32)
Calcium: 9.3 mg/dL (ref 8.6–10.4)
Chloride: 107 mmol/L (ref 98–110)
Creat: 0.92 mg/dL (ref 0.50–1.03)
Globulin: 2.5 g/dL (ref 1.9–3.7)
Glucose, Bld: 83 mg/dL (ref 65–99)
Potassium: 4.7 mmol/L (ref 3.5–5.3)
Sodium: 140 mmol/L (ref 135–146)
Total Bilirubin: 0.4 mg/dL (ref 0.2–1.2)
Total Protein: 6.8 g/dL (ref 6.1–8.1)
eGFR: 74 mL/min/1.73m2

## 2024-07-30 LAB — ANTI-NUCLEAR AB-TITER (ANA TITER): ANA Titer 1: 1:40 {titer} — ABNORMAL HIGH

## 2024-07-30 LAB — HEMOGLOBIN A1C
Hgb A1c MFr Bld: 5.3 %
Mean Plasma Glucose: 105 mg/dL
eAG (mmol/L): 5.8 mmol/L

## 2024-07-30 LAB — C-REACTIVE PROTEIN: CRP: <3 mg/L

## 2024-07-30 LAB — SEDIMENTATION RATE: Sed Rate: 2 mm/h (ref 0–30)

## 2024-07-30 LAB — ANA: Anti Nuclear Antibody (ANA): POSITIVE — AB

## 2024-07-30 LAB — CYCLIC CITRUL PEPTIDE ANTIBODY, IGG: Cyclic Citrullin Peptide Ab: <16 U

## 2024-09-21 ENCOUNTER — Ambulatory Visit: Payer: PRIVATE HEALTH INSURANCE | Admitting: Internal Medicine
